# Patient Record
Sex: Female | Born: 1960
Health system: Southern US, Community
[De-identification: ages and names within clinical notes are randomized; demographics above are authoritative.]

## PROBLEM LIST (undated history)

## (undated) DIAGNOSIS — Z973 Presence of spectacles and contact lenses: Secondary | ICD-10-CM

## (undated) DIAGNOSIS — N950 Postmenopausal bleeding: Secondary | ICD-10-CM

## (undated) DIAGNOSIS — E042 Nontoxic multinodular goiter: Secondary | ICD-10-CM

## (undated) HISTORY — PX: HYSTEROSCOPY: SHX211

---

## 2008-08-09 ENCOUNTER — Other Ambulatory Visit: Admission: RE | Admit: 2008-08-09 | Discharge: 2008-08-09 | Payer: Self-pay | Admitting: Obstetrics and Gynecology

## 2008-11-30 HISTORY — PX: CARPAL TUNNEL RELEASE: SHX101

## 2008-12-12 ENCOUNTER — Ambulatory Visit: Payer: Self-pay | Admitting: General Practice

## 2008-12-17 ENCOUNTER — Ambulatory Visit: Payer: Self-pay | Admitting: General Practice

## 2009-02-05 ENCOUNTER — Encounter: Payer: Self-pay | Admitting: General Practice

## 2009-02-28 ENCOUNTER — Encounter: Payer: Self-pay | Admitting: General Practice

## 2009-11-12 ENCOUNTER — Ambulatory Visit: Payer: Self-pay | Admitting: Orthopedic Surgery

## 2012-10-07 LAB — HM MAMMOGRAPHY

## 2013-08-23 ENCOUNTER — Ambulatory Visit: Payer: Self-pay | Admitting: Certified Nurse Midwife

## 2013-08-25 ENCOUNTER — Ambulatory Visit: Payer: Self-pay | Admitting: Family Medicine

## 2013-09-11 ENCOUNTER — Ambulatory Visit (INDEPENDENT_AMBULATORY_CARE_PROVIDER_SITE_OTHER): Payer: BC Managed Care – PPO | Admitting: Certified Nurse Midwife

## 2013-09-11 ENCOUNTER — Encounter: Payer: Self-pay | Admitting: Certified Nurse Midwife

## 2013-09-11 VITALS — BP 110/60 | HR 64 | Resp 16 | Ht 62.75 in | Wt 139.0 lb

## 2013-09-11 DIAGNOSIS — Z309 Encounter for contraceptive management, unspecified: Secondary | ICD-10-CM

## 2013-09-11 DIAGNOSIS — Z0000 Encounter for general adult medical examination without abnormal findings: Secondary | ICD-10-CM

## 2013-09-11 DIAGNOSIS — Z01419 Encounter for gynecological examination (general) (routine) without abnormal findings: Secondary | ICD-10-CM

## 2013-09-11 LAB — POCT URINALYSIS DIPSTICK
Glucose, UA: NEGATIVE
Ketones, UA: NEGATIVE
Leukocytes, UA: NEGATIVE
Urobilinogen, UA: NEGATIVE

## 2013-09-11 MED ORDER — NORETHIN ACE-ETH ESTRAD-FE 1-20 MG-MCG PO TABS: 1.0000 | ORAL_TABLET | Freq: Every day | ORAL | Status: DC

## 2013-09-11 NOTE — Progress Notes (Signed)
52 y.o. G66P1001 Married Caucasian Fe here for annual exam. Periods normal, no issues. Now a grandmother! No health issues today.Contraception working well. Sees PCP prn.   Patient's last menstrual period was 08/31/2013.          Sexually active: yes  The current method of family planning is OCP (estrogen/progesterone).    Exercising: yes  walking Smoker:  no  Health Maintenance: Pap:  08-22-12 neg HPV HR neg MMG:  11/13 Colonoscopy:  none BMD:   none TDaP: 12/01/2003 Labs: Poct urine- neg, Hgb-13.1 Self breast exam: done occ   reports that she has quit smoking. She does not have any smokeless tobacco history on file. She reports that she does not drink alcohol or use illicit drugs.  History reviewed. No pertinent past medical history.  Past Surgical History  Procedure Laterality Date  . Carpal tunnel release Right     Current Outpatient Prescriptions  Medication Sig Dispense Refill  . Cholecalciferol (VITAMIN D PO) Take by mouth daily.      . naproxen sodium (ANAPROX) 220 MG tablet Take 220 mg by mouth 2 (two) times daily with a meal.      . norethindrone-ethinyl estradiol (JUNEL FE,GILDESS FE,LOESTRIN FE) 1-20 MG-MCG tablet Take 1 tablet by mouth daily.       No current facility-administered medications for this visit.    Family History  Problem Relation Age of Onset  . Hypertension Mother   . Thyroid disease Mother   . Diabetes Father   . Cancer Father     prostate  . Hypertension Father   . Heart disease Father     ROS:  Pertinent items are noted in HPI.  Otherwise, a comprehensive ROS was negative.  Exam:   BP 110/60  Pulse 64  Resp 16  Ht 5' 2.75" (1.594 m)  Wt 139 lb (63.05 kg)  BMI 24.81 kg/m2  LMP 08/31/2013 Height: 5' 2.75" (159.4 cm)  Ht Readings from Last 3 Encounters:  09/11/13 5' 2.75" (1.594 m)    General appearance: alert, cooperative and appears stated age Head: Normocephalic, without obvious abnormality, atraumatic Neck: no adenopathy,  supple, symmetrical, trachea midline and thyroid normal to inspection and palpation Lungs: clear to auscultation bilaterally Breasts: normal appearance, no masses or tenderness, No nipple retraction or dimpling, No nipple discharge or bleeding, No axillary or supraclavicular adenopathy Heart: regular rate and rhythm Abdomen: soft, non-tender; no masses,  no organomegaly Extremities: extremities normal, atraumatic, no cyanosis or edema Skin: Skin color, texture, turgor normal. No rashes or lesions Lymph nodes: Cervical, supraclavicular, and axillary nodes normal. No abnormal inguinal nodes palpated Neurologic: Grossly normal   Pelvic: External genitalia:  no lesions              Urethra:  normal appearing urethra with no masses, tenderness or lesions              Bartholin's and Skene's: normal                 Vagina: normal appearing vagina with normal color and discharge, no lesions              Cervix: normal, non tender              Pap taken: no Bimanual Exam:  Uterus:  normal size, contour, position, consistency, mobility, non-tender and anteverted              Adnexa: normal adnexa and no mass, fullness, tenderness  Rectovaginal: Confirms               Anus:  normal sphincter tone, no lesions  A:  Well Woman with normal exam  Contraception OCP  Non fasting labs  P:   Reviewed health and wellness pertinent to exam  Rx Loestrin 1/20 see order  Labs:CMP,Lipid panel,TSH  Pap smear as per guidelines   Mammogram yearly pap smear not taken today  counseled on breast self exam, mammography screening, use and side effects of OCP's, menopause, adequate intake of calcium and vitamin D, diet and exercise  return annually or prn  An After Visit Summary was printed and given to the patient.

## 2013-09-11 NOTE — Patient Instructions (Signed)

## 2013-09-12 LAB — COMPREHENSIVE METABOLIC PANEL
ALT: 13 U/L (ref 0–35)
Albumin: 3.8 g/dL (ref 3.5–5.2)
CO2: 23 mEq/L (ref 19–32)
Calcium: 9.4 mg/dL (ref 8.4–10.5)
Chloride: 105 mEq/L (ref 96–112)
Glucose, Bld: 76 mg/dL (ref 70–99)
Sodium: 138 mEq/L (ref 135–145)
Total Bilirubin: 0.6 mg/dL (ref 0.3–1.2)
Total Protein: 6.5 g/dL (ref 6.0–8.3)

## 2013-09-12 LAB — HEMOGLOBIN, FINGERSTICK: Hemoglobin, fingerstick: 13.1 g/dL (ref 12.0–16.0)

## 2013-09-12 LAB — LIPID PANEL
Cholesterol: 151 mg/dL (ref 0–200)
Total CHOL/HDL Ratio: 2.8 Ratio

## 2013-09-14 NOTE — Progress Notes (Signed)
Note reviewed, agree with plan.  Sanjuanita Condrey, MD  

## 2014-09-10 ENCOUNTER — Other Ambulatory Visit: Payer: Self-pay | Admitting: Certified Nurse Midwife

## 2014-09-10 NOTE — Telephone Encounter (Signed)
Last AEX: 09/11/13 Last refill:09/11/13 #1 X 12 Current AEX:09/17/14  Sent 1 mth supply in until Pt is seen for her AEX

## 2014-09-17 ENCOUNTER — Telehealth: Payer: Self-pay | Admitting: Certified Nurse Midwife

## 2014-09-17 ENCOUNTER — Ambulatory Visit: Payer: BC Managed Care – PPO | Admitting: Certified Nurse Midwife

## 2014-09-17 NOTE — Telephone Encounter (Signed)
No I think that it is not appropriate

## 2014-09-17 NOTE — Telephone Encounter (Signed)
Pt called and canceled her appt for today at 3:30 pm. Pt says that her father has to have emergency surgery this morning she just found out 15 mins ago. Pt rescheduled for tomorrow at 3:30 pm. Do you want to charge pt for late cancellation?

## 2014-09-17 NOTE — Telephone Encounter (Signed)
Okay didn't think so. Okay to close encounter.

## 2014-09-17 NOTE — Telephone Encounter (Signed)
Yes closed

## 2014-09-18 ENCOUNTER — Ambulatory Visit (INDEPENDENT_AMBULATORY_CARE_PROVIDER_SITE_OTHER): Payer: BC Managed Care – PPO | Admitting: Certified Nurse Midwife

## 2014-09-18 ENCOUNTER — Encounter: Payer: Self-pay | Admitting: Certified Nurse Midwife

## 2014-09-18 VITALS — BP 122/72 | HR 68 | Ht 62.75 in | Wt 136.0 lb

## 2014-09-18 DIAGNOSIS — Z01419 Encounter for gynecological examination (general) (routine) without abnormal findings: Secondary | ICD-10-CM

## 2014-09-18 DIAGNOSIS — Z0000 Encounter for general adult medical examination without abnormal findings: Secondary | ICD-10-CM

## 2014-09-18 DIAGNOSIS — Z124 Encounter for screening for malignant neoplasm of cervix: Secondary | ICD-10-CM

## 2014-09-18 DIAGNOSIS — Z23 Encounter for immunization: Secondary | ICD-10-CM

## 2014-09-18 LAB — POCT URINALYSIS DIPSTICK
Bilirubin, UA: NEGATIVE
Blood, UA: NEGATIVE
GLUCOSE UA: NEGATIVE
KETONES UA: NEGATIVE
Leukocytes, UA: NEGATIVE
Nitrite, UA: NEGATIVE
Protein, UA: NEGATIVE
Urobilinogen, UA: NEGATIVE
pH, UA: 6

## 2014-09-18 LAB — HEMOGLOBIN, FINGERSTICK: HEMOGLOBIN, FINGERSTICK: 12.9 g/dL (ref 12.0–16.0)

## 2014-09-18 MED ORDER — NORETHIN-ETH ESTRAD-FE BIPHAS 1 MG-10 MCG / 10 MCG PO TABS: 1.0000 | ORAL_TABLET | Freq: Every day | ORAL | Status: DC

## 2014-09-18 NOTE — Progress Notes (Signed)
53 y.o. 411P1001 Married Caucasian Fe here for annual exam. Periods lighter, but other wise normal. OCP working well, desires continuance. Sees PCP for aex/ labs as indicated. Social stress with father diagnosed with progressive lung cancer. 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 Can not use Hospice unless treatment ceases, which will stop due to complications. Porta cath removed. Spouse supportive and daughter. No other health issues today.    Patient's last menstrual period was 08/30/2014.          Sexually active: Yes.    The current method of family planning is OCP (estrogen/progesterone).    Exercising: No.  The patient does not participate in regular exercise at present. Smoker:  no  Health Maintenance: Pap:  08/22/13 neg HR HPV  MMG:  10/10/13 Bi-Rads Neg Colonoscopy:  never BMD:   never TDaP:  2005 Labs:   HgB:  12.9                           UA:  Neg, ph: 6.0   reports that she has quit smoking. She does not have any smokeless tobacco history on file. She reports that she does not drink alcohol or use illicit drugs.  History reviewed. No pertinent past medical history.  Past Surgical History  Procedure Laterality Date  . Carpal tunnel release Right     Current Outpatient Prescriptions  Medication Sig Dispense Refill  . Cholecalciferol (VITAMIN D PO) Take by mouth daily.      Marland Kitchen. GILDESS FE 1/20 1-20 MG-MCG tablet take 1 tablet by mouth once daily  28 tablet  0  . naproxen sodium (ANAPROX) 220 MG tablet Take 220 mg by mouth 2 (two) times daily with a meal.       No current facility-administered medications for this visit.    Family History  Problem Relation Age of Onset  . Hypertension Mother   . Thyroid disease Mother   . Diabetes Father   . Cancer Father     prostate  . Hypertension Father   . Heart disease Father     ROS:  Pertinent items are noted in HPI.  Otherwise, a comprehensive ROS was negative.  Exam:   BP 122/72   Pulse 68  Ht 5' 2.75" (1.594 m)  Wt 136 lb (61.689 kg)  BMI 24.28 kg/m2  LMP 08/30/2014 Height: 5' 2.75" (159.4 cm)  Ht Readings from Last 3 Encounters:  09/18/14 5' 2.75" (1.594 m)  09/11/13 5' 2.75" (1.594 m)    General appearance: alert, cooperative and appears stated age Head: Normocephalic, without obvious abnormality, atraumatic Neck: no adenopathy, supple, symmetrical, trachea midline and thyroid normal to inspection and palpation Lungs: clear to auscultation bilaterally Breasts: normal appearance, no masses or tenderness, No nipple retraction or dimpling, No nipple discharge or bleeding, No axillary or supraclavicular adenopathy Heart: regular rate and rhythm Abdomen: soft, non-tender; no masses,  no organomegaly Extremities: extremities normal, atraumatic, no cyanosis or edema Skin: Skin color, texture, turgor normal. No rashes or lesions Lymph nodes: Cervical, supraclavicular, and axillary nodes normal. No abnormal inguinal nodes palpated Neurologic: Grossly normal   Pelvic: External genitalia:  no lesions              Urethra:  normal appearing urethra with no masses, tenderness or lesions              Bartholin's and Skene's: normal  Vagina: normal appearing vagina with normal color and discharge, no lesions              Cervix: normal, non tender              Pap taken: No. Bimanual Exam:  Uterus:  normal size, contour, position, consistency, mobility, non-tender and anteverted              Adnexa: normal adnexa and no mass, fullness, tenderness               Rectovaginal: Confirms               Anus:  normal sphincter tone, no lesions  A:  Well Woman with normal exam  Contraception OCP desired  Social stress with father's lung cancer  Immunization update  P:   Reviewed health and wellness pertinent to exam  Discussed due to age need to reduce dosage, patient agreeable.  Rx Lo loestrin Fe see order  Discussed friends support and hospice emotional  support services. Patient will contact and seek what is available.  Requested TDAP  Pap smear  taken today with HPV reflex  counseled on breast self exam, mammography screening, use and side effects of OCP's, adequate intake of calcium and vitamin D, diet and exercise Prayed with patient and her needs per request.  return annually or prn  An After Visit Summary was printed and given to the patient.

## 2014-09-18 NOTE — Patient Instructions (Signed)

## 2014-09-20 LAB — IPS PAP TEST WITH REFLEX TO HPV

## 2014-09-21 NOTE — Progress Notes (Signed)
Reviewed personally.  M. Suzanne Aroura Vasudevan, MD.  

## 2014-10-01 ENCOUNTER — Encounter: Payer: Self-pay | Admitting: Certified Nurse Midwife

## 2015-09-25 ENCOUNTER — Ambulatory Visit: Payer: Self-pay | Admitting: Certified Nurse Midwife

## 2015-09-26 ENCOUNTER — Encounter: Payer: Self-pay | Admitting: Certified Nurse Midwife

## 2015-09-26 ENCOUNTER — Ambulatory Visit (INDEPENDENT_AMBULATORY_CARE_PROVIDER_SITE_OTHER): Payer: BLUE CROSS/BLUE SHIELD | Admitting: Certified Nurse Midwife

## 2015-09-26 VITALS — BP 118/70 | HR 72 | Resp 16 | Ht 62.5 in | Wt 149.6 lb

## 2015-09-26 DIAGNOSIS — Z0000 Encounter for general adult medical examination without abnormal findings: Secondary | ICD-10-CM

## 2015-09-26 DIAGNOSIS — Z01419 Encounter for gynecological examination (general) (routine) without abnormal findings: Secondary | ICD-10-CM

## 2015-09-26 DIAGNOSIS — Z1211 Encounter for screening for malignant neoplasm of colon: Secondary | ICD-10-CM | POA: Diagnosis not present

## 2015-09-26 LAB — POCT URINALYSIS DIPSTICK
Bilirubin, UA: NEGATIVE
COLOR UA: NEGATIVE
Clarity, UA: NEGATIVE
Glucose, UA: NEGATIVE
Ketones, UA: NEGATIVE
Leukocytes, UA: NEGATIVE
NITRITE UA: NEGATIVE
Protein, UA: NEGATIVE
RBC UA: NEGATIVE
Urobilinogen, UA: NEGATIVE
pH, UA: 5

## 2015-09-26 LAB — TSH: TSH: 3.473 u[IU]/mL (ref 0.350–4.500)

## 2015-09-26 LAB — COMPREHENSIVE METABOLIC PANEL
ALBUMIN: 4.2 g/dL (ref 3.6–5.1)
ALT: 12 U/L (ref 6–29)
AST: 19 U/L (ref 10–35)
Alkaline Phosphatase: 59 U/L (ref 33–130)
BILIRUBIN TOTAL: 0.5 mg/dL (ref 0.2–1.2)
BUN: 13 mg/dL (ref 7–25)
CALCIUM: 9.5 mg/dL (ref 8.6–10.4)
CO2: 24 mmol/L (ref 20–31)
CREATININE: 1.08 mg/dL — AB (ref 0.50–1.05)
Chloride: 106 mmol/L (ref 98–110)
Glucose, Bld: 91 mg/dL (ref 65–99)
Potassium: 5.1 mmol/L (ref 3.5–5.3)
SODIUM: 138 mmol/L (ref 135–146)
TOTAL PROTEIN: 6.9 g/dL (ref 6.1–8.1)

## 2015-09-26 LAB — CBC
HCT: 41.4 % (ref 36.0–46.0)
Hemoglobin: 14.3 g/dL (ref 12.0–15.0)
MCH: 31.6 pg (ref 26.0–34.0)
MCHC: 34.5 g/dL (ref 30.0–36.0)
MCV: 91.4 fL (ref 78.0–100.0)
MPV: 9.9 fL (ref 8.6–12.4)
PLATELETS: 325 10*3/uL (ref 150–400)
RBC: 4.53 MIL/uL (ref 3.87–5.11)
RDW: 12.2 % (ref 11.5–15.5)
WBC: 7.5 10*3/uL (ref 4.0–10.5)

## 2015-09-26 LAB — HEMOGLOBIN, FINGERSTICK: HEMOGLOBIN, FINGERSTICK: 13.9 g/dL (ref 12.0–16.0)

## 2015-09-26 LAB — LIPID PANEL
CHOLESTEROL: 193 mg/dL (ref 125–200)
HDL: 50 mg/dL (ref >=46)
LDL Cholesterol: 125 mg/dL (ref <130)
Total CHOL/HDL Ratio: 3.9 Ratio (ref <=5.0)
Triglycerides: 89 mg/dL (ref <150)
VLDL: 18 mg/dL (ref <30)

## 2015-09-26 NOTE — Progress Notes (Signed)
Patient ID: Renee Abbott, female   DOB: 1961-04-13, 54 y.o.   MRN: [id number] 54 y.o. G80P1001 Married  Caucasian Fe here for annual exam. Periods sporadic on OCP. Denies hot flashes or night sweats. Patient aware of need to be off OCP now at 54. On last week of current pack of OCP now. Spouse has had vasectomy so contraception is not a concern, using only for cycle control. Would be interested in HRT if perimenopausal. Sees PCP prn only. Dad passed after long battle with lung cancer in 1/16. "Still really miss him". Daughter expecting again!, but has placenta previa, so concerned for her. Spouse supportive and friends. Desires screening labs today if needed. No other health issues today.  No LMP recorded.          Sexually active: Yes.    The current method of family planning is OCP (estrogen/progesterone)--Lo Loestrin.    Exercising: No.   Smoker:  Former  Health Maintenance: Pap:  09-18-14 Neg;no HR HPV done(neg HR HPV 2013) MMG:  Density Cat.C/Neg/BiRads1:[facility] Colonoscopy:  NEVER BMD:   n/a TDaP:  09-18-14 Labs: Hgb 13.9, Urine Dip:Neg   reports that she has quit smoking. She does not have any smokeless tobacco history on file. She reports that she does not drink alcohol or use illicit drugs.  No past medical history on file.  Past Surgical History  Procedure Laterality Date  . Carpal tunnel release Right     Current Outpatient Prescriptions  Medication Sig Dispense Refill  . Cholecalciferol (VITAMIN D PO) Take by mouth daily.    . naproxen sodium (ANAPROX) 220 MG tablet Take 220 mg by mouth 2 (two) times daily with a meal.    . Norethindrone-Ethinyl Estradiol-Fe Biphas (LO LOESTRIN FE) 1 MG-10 MCG / 10 MCG tablet Take 1 tablet by mouth daily. 1 Package 12   No current facility-administered medications for this visit.    Family History  Problem Relation Age of Onset  . Hypertension Mother   . Thyroid disease Mother   . Diabetes Father   . Cancer Father    prostate, Lung dx 06/26/14  . Hypertension Father   . Heart disease Father     ROS:  Pertinent items are noted in HPI.  Otherwise, a comprehensive ROS was negative.  Exam:   BP 140/88 mmHg  Pulse 60  Resp 16  Ht 5' 2.5" (1.588 m)  Wt 149 lb 9.6 oz (67.858 kg)  BMI 26.91 kg/m2 Height: 5' 2.5" (158.8 cm) Ht Readings from Last 3 Encounters:  09/26/15 5' 2.5" (1.588 m)  09/18/14 5' 2.75" (1.594 m)  09/11/13 5' 2.75" (1.594 m)    General appearance: alert, cooperative and appears stated age Head: Normocephalic, without obvious abnormality, atraumatic Neck: no adenopathy, supple, symmetrical, trachea midline and thyroid normal to inspection and palpation Lungs: clear to auscultation bilaterally Breasts: normal appearance, no masses or tenderness, No nipple retraction or dimpling, No nipple discharge or bleeding, No axillary or supraclavicular adenopathy Heart: regular rate and rhythm Abdomen: soft, non-tender; no masses,  no organomegaly Extremities: extremities normal, atraumatic, no cyanosis or edema Skin: Skin color, texture, turgor normal. No rashes or lesions Lymph nodes: Cervical, supraclavicular, and axillary nodes normal. No abnormal inguinal nodes palpated Neurologic: Grossly normal   Pelvic: External genitalia:  no lesions              Urethra:  normal appearing urethra with no masses, tenderness or lesions  Bartholin's and Skene's: normal                 Vagina: normal appearing vagina with normal color and discharge, no lesions              Cervix: normal appearance, non tender, no lesions              Pap taken: No. Bimanual Exam:  Uterus:  normal size, contour, position, consistency, mobility, non-tender              Adnexa: normal adnexa and no mass, fullness, tenderness               Rectovaginal: Confirms               Anus:  normal sphincter tone, no lesions    A:  Well Woman with normal exam  Contraception OCP for cycle control only, spouse  vasectomy  ? Perimenopausal with amenorrhea episodes  Social stress with family death and daughter pregnancy  Screening labs  P:   Reviewed health and wellness pertinent to exam  Discussed need to be off OCP now due to age. [facility] two [facility].(order placed) Will not restart OCP, if perimenopausal will consider HRT. Patient agrees with plan. Risks and benefits of HRT given. Questions addressed.  Discussed grief support through Hospice who her Dad used. Patient may try. Will advise if feels depressed.  Labs:TSH,Lipid panel, CBC, CMP, Vit. D  Pap smear as above not taken   counseled on breast self exam, mammography screening, use and side effects of HRT, adequate intake of calcium and vitamin D, diet and exercise. Discussed risks and benefits of colonoscopy, declines scheduling today. IFOB dispensed.  return annually or prn  An After Visit Summary was printed and given to the patient.

## 2015-09-26 NOTE — Patient Instructions (Signed)
EXERCISE AND DIET:  We recommended that you start or continue a regular exercise program for good health. Regular exercise means any activity that makes your heart beat faster and makes you sweat.  We recommend exercising at least 30 minutes per day at least 3 days a week, preferably 4 or 5.  We also recommend a diet low in fat and sugar.  Inactivity, poor dietary choices and obesity can cause diabetes, heart attack, stroke, and kidney damage, among others.    ALCOHOL AND SMOKING:  Women should limit their alcohol intake to no more than 7 drinks/beers/glasses of wine (combined, not each!) per week. Moderation of alcohol intake to this level decreases your risk of breast cancer and liver damage. And of course, no recreational drugs are part of a healthy lifestyle.  And absolutely no smoking or even second hand smoke. Most people know smoking can cause heart and lung diseases, but did you know it also contributes to weakening of your bones? Aging of your skin?  Yellowing of your teeth and nails?  CALCIUM AND VITAMIN D:  Adequate intake of calcium and Vitamin D are recommended.  The recommendations for exact amounts of these supplements seem to change often, but generally speaking 600 mg of calcium (either carbonate or citrate) and 800 units of Vitamin D per day seems prudent. Certain women may benefit from higher intake of Vitamin D.  If you are among these women, your doctor will have told you during your visit.    PAP SMEARS:  Pap smears, to check for cervical cancer or precancers,  have traditionally been done yearly, although recent scientific advances have shown that most women can have pap smears less often.  However, every woman still should have a physical exam from her gynecologist every year. It will include a breast check, inspection of the vulva and vagina to check for abnormal growths or skin changes, a visual exam of the cervix, and then an exam to evaluate the size and shape of the uterus and  ovaries.  And after 52 years of age, a rectal exam is indicated to check for rectal cancers. We will also provide age appropriate advice regarding health maintenance, like when you should have certain vaccines, screening for sexually transmitted diseases, bone density testing, colonoscopy, mammograms, etc.   MAMMOGRAMS:  All women over 40 years old should have a yearly mammogram. Many facilities now offer a "3D" mammogram, which may cost around $50 extra out of pocket. If possible,  we recommend you accept the option to have the 3D mammogram performed.  It both reduces the number of women who will be called back for extra views which then turn out to be normal, and it is better than the routine mammogram at detecting truly abnormal areas.    COLONOSCOPY:  Colonoscopy to screen for colon cancer is recommended for all women at age 50.  We know, you hate the idea of the prep.  We agree, BUT, having colon cancer and not knowing it is worse!!  Colon cancer so often starts as a polyp that can be seen and removed at colonscopy, which can quite literally save your life!  And if your first colonoscopy is normal and you have no family history of colon cancer, most women don't have to have it again for 10 years.  Once every ten years, you can do something that may end up saving your life, right?  We will be happy to help you get it scheduled when you are ready.    Be sure to check your insurance coverage so you understand how much it will cost.  It may be covered as a preventative service at no cost, but you should check your particular policy.     Perimenopause Perimenopause is the time when your body begins to move into the menopause (no menstrual period for 12 straight months). It is a natural process. Perimenopause can begin 2-8 years before the menopause and usually lasts for 1 year after the menopause. During this time, your ovaries may or may not produce an egg. The ovaries vary in their production of estrogen and  progesterone hormones each month. This can cause irregular menstrual periods, difficulty getting pregnant, vaginal bleeding between periods, and uncomfortable symptoms. CAUSES  Irregular production of the ovarian hormones, estrogen and progesterone, and not ovulating every month.  Other causes include:  Tumor of the pituitary gland in the brain.  Medical disease that affects the ovaries.  Radiation treatment.  Chemotherapy.  Unknown causes.  Heavy smoking and excessive alcohol intake can bring on perimenopause sooner. SIGNS AND SYMPTOMS   Hot flashes.  Night sweats.  Irregular menstrual periods.  Decreased sex drive.  Vaginal dryness.  Headaches.  Mood swings.  Depression.  Memory problems.  Irritability.  Tiredness.  Weight gain.  Trouble getting pregnant.  The beginning of losing bone cells (osteoporosis).  The beginning of hardening of the arteries (atherosclerosis). DIAGNOSIS  Your health care provider will make a diagnosis by analyzing your age, menstrual history, and symptoms. He or she will do a physical exam and note any changes in your body, especially your female organs. Female hormone tests may or may not be helpful depending on the amount of female hormones you produce and when you produce them. However, other hormone tests may be helpful to rule out other problems. TREATMENT  In some cases, no treatment is needed. The decision on whether treatment is necessary during the perimenopause should be made by you and your health care provider based on how the symptoms are affecting you and your lifestyle. Various treatments are available, such as:  Treating individual symptoms with a specific medicine for that symptom.  Herbal medicines that can help specific symptoms.  Counseling.  Group therapy. HOME CARE INSTRUCTIONS   Keep track of your menstrual periods (when they occur, how heavy they are, how long between periods, and how long they last) as  well as your symptoms and when they started.  Only take over-the-counter or prescription medicines as directed by your health care provider.  Sleep and rest.  Exercise.  Eat a diet that contains calcium (good for your bones) and soy (acts like the estrogen hormone).  Do not smoke.  Avoid alcoholic beverages.  Take vitamin supplements as recommended by your health care provider. Taking vitamin E may help in certain cases.  Take calcium and vitamin D supplements to help prevent bone loss.  Group therapy is sometimes helpful.  Acupuncture may help in some cases. SEEK MEDICAL CARE IF:   You have questions about any symptoms you are having.  You need a referral to a specialist (gynecologist, psychiatrist, or psychologist). SEEK IMMEDIATE MEDICAL CARE IF:   You have vaginal bleeding.  Your period lasts longer than 8 days.  Your periods are recurring sooner than 21 days.  You have bleeding after intercourse.  You have severe depression.  You have pain when you urinate.  You have severe headaches.  You have vision problems.   This information is not intended to replace advice   given to you by your health care provider. Make sure you discuss any questions you have with your health care provider.   Document Released: 12/24/2004 Document Revised: 12/07/2014 Document Reviewed: 06/15/2013 Elsevier Interactive Patient Education 2016 [employer]  

## 2015-09-26 NOTE — Progress Notes (Signed)
Reviewed personally.  M. Suzanne Johana Hopkinson, MD.  

## 2015-09-27 ENCOUNTER — Other Ambulatory Visit: Payer: Self-pay | Admitting: Certified Nurse Midwife

## 2015-09-27 ENCOUNTER — Telehealth: Payer: Self-pay | Admitting: *Deleted

## 2015-09-27 DIAGNOSIS — R899 Unspecified abnormal finding in specimens from other organs, systems and tissues: Secondary | ICD-10-CM

## 2015-09-27 LAB — VITAMIN D 25 HYDROXY (VIT D DEFICIENCY, FRACTURES): Vit D, 25-Hydroxy: 63 ng/mL (ref 30–100)

## 2015-09-27 NOTE — Telephone Encounter (Signed)
Notes Recorded by Dion Bodyeina C Beltran, CMA on 09/27/2015 at 9:17 AM LM for pt to call back. Notes Recorded by Verner Choleborah S Leonard, CNM on 09/27/2015 at 7:54 AM Notify patient that her Vitamin D is normal Lipid panel looks great TSH and CBC is normal Liver, glucose and kidney profile essentially normal, creatinine is slightly elevated, decrease protein intake if eating high amounts and will recheck with your other lab in 2- 3 weeks, order placed

## 2015-09-30 NOTE — Telephone Encounter (Signed)
Returning call.

## 2015-09-30 NOTE — Telephone Encounter (Signed)
Left Voicemail to call back

## 2015-10-01 NOTE — Telephone Encounter (Signed)
Patient notified of results. See lab 

## 2015-10-03 ENCOUNTER — Other Ambulatory Visit: Payer: Self-pay | Admitting: Certified Nurse Midwife

## 2015-10-03 NOTE — Telephone Encounter (Signed)
No refills to be given. She is coming for labs to start on HRT. denied

## 2015-10-03 NOTE — Telephone Encounter (Signed)
Medication refill request: Lo Loestrin Last AEX:  09-26-15 Next AEX: 09-30-16 Last MMG (if hormonal medication request): 10-12-14 WNL Refill authorized: please advise

## 2015-10-09 ENCOUNTER — Ambulatory Visit: Payer: Self-pay | Admitting: Certified Nurse Midwife

## 2015-10-10 ENCOUNTER — Other Ambulatory Visit: Payer: Self-pay | Admitting: Certified Nurse Midwife

## 2015-10-10 ENCOUNTER — Other Ambulatory Visit (INDEPENDENT_AMBULATORY_CARE_PROVIDER_SITE_OTHER): Payer: BLUE CROSS/BLUE SHIELD

## 2015-10-10 DIAGNOSIS — R899 Unspecified abnormal finding in specimens from other organs, systems and tissues: Secondary | ICD-10-CM

## 2015-10-10 LAB — CREATININE, SERUM: CREATININE: 0.9 mg/dL (ref 0.50–1.05)

## 2015-10-11 ENCOUNTER — Other Ambulatory Visit: Payer: Self-pay | Admitting: Certified Nurse Midwife

## 2015-10-11 DIAGNOSIS — R899 Unspecified abnormal finding in specimens from other organs, systems and tissues: Secondary | ICD-10-CM

## 2015-10-11 LAB — FOLLICLE STIMULATING HORMONE: FSH: 87.7 m[IU]/mL

## 2015-10-11 LAB — PROLACTIN: PROLACTIN: 7.7 ng/mL

## 2015-10-17 LAB — FECAL OCCULT BLOOD, IMMUNOCHEMICAL: IMMUNOLOGICAL FECAL OCCULT BLOOD TEST: NEGATIVE

## 2015-11-01 ENCOUNTER — Ambulatory Visit: Payer: BLUE CROSS/BLUE SHIELD | Admitting: Certified Nurse Midwife

## 2015-11-08 ENCOUNTER — Encounter: Payer: Self-pay | Admitting: Certified Nurse Midwife

## 2015-11-08 ENCOUNTER — Ambulatory Visit (INDEPENDENT_AMBULATORY_CARE_PROVIDER_SITE_OTHER): Payer: BLUE CROSS/BLUE SHIELD | Admitting: Certified Nurse Midwife

## 2015-11-08 VITALS — BP 122/70 | Ht 62.5 in | Wt 152.0 lb

## 2015-11-08 DIAGNOSIS — N951 Menopausal and female climacteric states: Secondary | ICD-10-CM

## 2015-11-08 NOTE — Progress Notes (Signed)
[age] yrs Married Caucasian female here to discuss intitation of  HRT. Has begun  to have hot flashes since she stopped OCP one month ago. Has had no menses on OCP in past 2-3 years. Some night sweats. Has read about HRT and feels well informed and would like to start on if no concerns.   Her medical history is significant for: no issues as below.  Heart disease no DVT No. Hypertension No. Osteopenia/ osteoporosis No. Breast Cancer No. Other prior cancer within 10 yrs No. Adherence and retention concerns No. Current Mammogram No. scheduled in 2 weeks Current Annual Exam Yes.   Low hematocrit or platelet count No. Elevated triglyceride level No. Other bio-identical current Hormonal treatment No.   FSH 87.7, TSH  3.473,  UPT negative,  See other labs and aex record.  Family Medical history is significant for:  Breast Cancer No. Ovarian Cancer No. Colon cancer No. Cardiovascular disease No.   Discussed WHI study with negative and positive benefits and risk of HRT including heart disease, CVA ,DVT and breast cancer.  Adverse events and side effects of hormone therapy:  Uterine bleeding expectations in combination therapies is 6 - 12 months, 40% and 60% respectively will be amenorrheic.  If bleeding continues after 6 months to call back. Use of estrogen may increase the risk of Gallbladder disease. No unopposed estrogen dosing.   A: Menopausal no contraindication for HRT use.  Mammogram due  Plan: After discussion,patient would like to start on HRT.  Patient will have mammogram and call when completed. If normal will start on Prempro. Instructions will be given for use and expectations. Warning signs with use given and will advise if vaginal bleeding occurs. Questions addressed.   Rv  In 2 months to assess once started on HRT   20  minutes spent with patient in face to face counseling regarding HRT usage.

## 2015-11-09 ENCOUNTER — Encounter: Payer: Self-pay | Admitting: Certified Nurse Midwife

## 2015-11-09 NOTE — Patient Instructions (Signed)

## 2015-11-13 NOTE — Progress Notes (Signed)
Reviewed personally.  M. Suzanne Patrick Salemi, MD.  

## 2015-11-14 ENCOUNTER — Encounter: Payer: Self-pay | Admitting: Certified Nurse Midwife

## 2015-11-14 ENCOUNTER — Telehealth: Payer: Self-pay | Admitting: Certified Nurse Midwife

## 2015-11-14 NOTE — Telephone Encounter (Signed)
Order for screening mammogram faxed to [facility] with cover sheet and confirmation at [phone]. Patient notified.  Routing to provider for final review. Patient agreeable to disposition. Will close encounter.

## 2015-11-14 NOTE — Telephone Encounter (Signed)
Patient says that [facility] where she gets her mammogram done needs a order for her mammogram faxed to them.  Fax # [phone]  Best # to reach patient for any questions: [phone]

## 2015-11-22 ENCOUNTER — Telehealth: Payer: Self-pay | Admitting: Certified Nurse Midwife

## 2015-11-22 NOTE — Telephone Encounter (Signed)
Patient calling to let Renee Abbott, CNM know she dad her mammogram at [facility] yesterday, 11/21/15. She said she was told to call with this information.

## 2015-11-22 NOTE — Telephone Encounter (Signed)
[facility] to [facility]. Spoke with Toni AmendCourtney who states the report has not been read yet. Will fax report over to the office once this is complete. Spoke with patient. Advised we are awaiting the report from [facility]. Once this has been received Leota Sauerseborah Leonard CNM will review and advise regarding HRT.

## 2015-11-27 ENCOUNTER — Other Ambulatory Visit: Payer: Self-pay | Admitting: Certified Nurse Midwife

## 2015-11-27 DIAGNOSIS — N951 Menopausal and female climacteric states: Secondary | ICD-10-CM

## 2015-11-27 MED ORDER — CONJ ESTROG-MEDROXYPROGEST ACE 0.3-1.5 MG PO TABS: 1.0000 | ORAL_TABLET | Freq: Every day | ORAL | Status: DC

## 2015-11-27 NOTE — Telephone Encounter (Signed)
Please notify patient that I have reviewed her mammogram and it is negative with class C density. Order in for HRT Prempro sent to pharmacy. Start on one daily, notify if any bleeding or warning signs reviewed at visit and read medication insert. Schedule 3 month evaluation

## 2015-11-27 NOTE — Telephone Encounter (Signed)
Patient notified as written by provider. appt scheduled.

## 2015-12-12 ENCOUNTER — Other Ambulatory Visit (INDEPENDENT_AMBULATORY_CARE_PROVIDER_SITE_OTHER): Payer: BLUE CROSS/BLUE SHIELD

## 2015-12-12 DIAGNOSIS — R899 Unspecified abnormal finding in specimens from other organs, systems and tissues: Secondary | ICD-10-CM

## 2015-12-12 LAB — CREATININE, SERUM: Creat: 0.9 mg/dL (ref 0.50–1.05)

## 2016-01-03 ENCOUNTER — Telehealth: Payer: Self-pay | Admitting: Certified Nurse Midwife

## 2016-01-03 NOTE — Telephone Encounter (Signed)
Spoke with patient. Advised of message as seen below from Deborah Leonard CNM. She is agreeable and verbalizes understanding.  Routing to provider for final review. Patient agreeable to disposition. Will close encounter.  

## 2016-01-03 NOTE — Telephone Encounter (Signed)
Patient left message on our voicemail wanting to speak with nurse in regards to issue she is having. Best # to reach: [phone]

## 2016-01-03 NOTE — Telephone Encounter (Signed)
It is not unusual with starting HRT which is a lower hormonal dose than her OCP. This should stop. She had not had any bleeding with OCP in the past few years. Needs to advise if does not stop in the by Monday or if amount increases.

## 2016-01-03 NOTE — Telephone Encounter (Signed)
Spoke with patient. Patient states that she started on Prempro in December 2016. "Debbi told me to call in if I had any bleeding with the medication." Reports last night she noticed "pink" tinged fluid when she wiped. States there was a small piece of red tissue in the toilet as well. "It was like the tissue when you have your cycle. I do not know if I should be worried or not. I am not having bleeding like my cycle." Reports this occurred again this morning. She denies any urinary symptoms, fevers, chills, or pelvic discomfort. Advised I will speak with Leota Sauers CNM regarding her recommendations and return call. She is agreeable and verbalizes understanding.  Routing to provider for final review. Patient agreeable to disposition. Will close encounter.

## 2016-02-27 ENCOUNTER — Encounter: Payer: Self-pay | Admitting: Certified Nurse Midwife

## 2016-02-27 ENCOUNTER — Ambulatory Visit (INDEPENDENT_AMBULATORY_CARE_PROVIDER_SITE_OTHER): Payer: BLUE CROSS/BLUE SHIELD | Admitting: Certified Nurse Midwife

## 2016-02-27 VITALS — BP 110/66 | HR 70 | Resp 16 | Ht 62.5 in | Wt 147.0 lb

## 2016-02-27 DIAGNOSIS — N951 Menopausal and female climacteric states: Secondary | ICD-10-CM | POA: Diagnosis not present

## 2016-02-27 MED ORDER — CONJ ESTROG-MEDROXYPROGEST ACE 0.3-1.5 MG PO TABS: 1.0000 | ORAL_TABLET | Freq: Every day | ORAL | Status: DC

## 2016-02-27 NOTE — Progress Notes (Signed)
55 y.o. Married Caucasian G1P1001here for evaluation of Prempro initiated on 11/26/16 for menopausal symptoms. Patient had small amount of bleeding with onset of use for 2 days. Patient taking medication as prescribed. Denies missed pills, headaches, nausea, DVT warning signs or symptoms, or other changes. Hot flashes and night sweats have subsided. No anxiety issues or vaginal dryness. Patient feels much better now. Would like to remain on HRT. Social stress with grandmother who is receiving end of life care. Patient trying to be available for family and daughter who has new baby. No other health issues today.  O: Healthy female, WD WN Affect: normal orientation X 3    A: Menopausal HRT Prempro initiation follow up, working well Social stress with grandmother in  Hospice care   P: Discussed continuation of HRT and expectations with relief of symptoms and bleeding possibility. Patient aware and would like to continue. Reviewed warning signs with use, importance of SBE monthly and yearly mammograms and aex. Rx Prempro see order Discussed seeking other family and friend support during this difficult time. Encouraged on focusing on the joys of being a Grandmother herself!  Rv aex, prn   20 minutes spent with patient with >50% of time spent in face to face counseling regarding HRT use and social stress.

## 2016-02-28 NOTE — Progress Notes (Signed)
Encounter reviewed Jill Jertson, MD   

## 2016-09-28 ENCOUNTER — Other Ambulatory Visit: Payer: Self-pay | Admitting: Certified Nurse Midwife

## 2016-09-30 ENCOUNTER — Ambulatory Visit (INDEPENDENT_AMBULATORY_CARE_PROVIDER_SITE_OTHER): Payer: BLUE CROSS/BLUE SHIELD | Admitting: Certified Nurse Midwife

## 2016-09-30 ENCOUNTER — Encounter: Payer: Self-pay | Admitting: Certified Nurse Midwife

## 2016-09-30 VITALS — BP 110/64 | HR 64 | Resp 16 | Ht 62.25 in | Wt 140.0 lb

## 2016-09-30 DIAGNOSIS — Z01419 Encounter for gynecological examination (general) (routine) without abnormal findings: Secondary | ICD-10-CM

## 2016-09-30 DIAGNOSIS — N951 Menopausal and female climacteric states: Secondary | ICD-10-CM | POA: Diagnosis not present

## 2016-09-30 DIAGNOSIS — Z124 Encounter for screening for malignant neoplasm of cervix: Secondary | ICD-10-CM

## 2016-09-30 DIAGNOSIS — Z0000 Encounter for general adult medical examination without abnormal findings: Secondary | ICD-10-CM | POA: Diagnosis not present

## 2016-09-30 DIAGNOSIS — Z1211 Encounter for screening for malignant neoplasm of colon: Secondary | ICD-10-CM

## 2016-09-30 LAB — POCT URINALYSIS DIPSTICK
BILIRUBIN UA: NEGATIVE
Glucose, UA: NEGATIVE
KETONES UA: NEGATIVE
Leukocytes, UA: NEGATIVE
Nitrite, UA: NEGATIVE
PH UA: 5
Protein, UA: NEGATIVE
RBC UA: NEGATIVE
Urobilinogen, UA: NEGATIVE

## 2016-09-30 LAB — TSH: TSH: 3.16 m[IU]/L

## 2016-09-30 MED ORDER — CONJ ESTROG-MEDROXYPROGEST ACE 0.3-1.5 MG PO TABS: 1.0000 | ORAL_TABLET | Freq: Every day | ORAL | 12 refills | Status: DC

## 2016-09-30 NOTE — Patient Instructions (Signed)

## 2016-09-30 NOTE — Progress Notes (Signed)
55 y.o. 741P1001 Married  Caucasian Fe here for annual exam. Menopausal with occasional hot flash. HRT working well without problems. Denies vaginal bleeding or vaginal dryness. [facility]. Screening labs as needed. Enjoying grandchildren!  Patient's last menstrual period was 08/30/2014 (exact date).          Sexually active: Yes.    The current method of family planning is post menopausal status.    Exercising: Yes.    walking Smoker:  no  Health Maintenance: Pap:  09-18-14 neg MMG:  11-21-15 category c density birads 1:neg Colonoscopy:  none BMD:   none TDaP:  2015 Shingles: no Pneumonia: no Hep C and HIV: Done Labs: hgb-13.1,poct urine-neg Self breast exam: done occ   reports that she has quit smoking. She has never used smokeless tobacco. She reports that she does not drink alcohol or use drugs.  History reviewed. No pertinent past medical history.  Past Surgical History:  Procedure Laterality Date  . CARPAL TUNNEL RELEASE Right     Current Outpatient Prescriptions  Medication Sig Dispense Refill  . Cholecalciferol (VITAMIN D PO) Take 1,000 Units by mouth daily.     Marland Kitchen. estrogen, conjugated,-medroxyprogesterone (PREMPRO) 0.3-1.5 MG tablet Take 1 tablet by mouth daily. 30 tablet 7  . naproxen sodium (ANAPROX) 220 MG tablet Take 220 mg by mouth 2 (two) times daily with a meal.     No current facility-administered medications for this visit.     Family History  Problem Relation Age of Onset  . Hypertension Mother   . Thyroid disease Mother   . Diabetes Father   . Cancer Father [id number]    Dec metastatic Lung CA 2015prostate, Lung dx 06/26/14  . Hypertension Father   . Heart disease Father     ROS:  Pertinent items are noted in HPI.  Otherwise, a comprehensive ROS was negative.  Exam:   BP 110/64   Pulse 64   Resp 16   Ht 5' 2.25" (1.581 m)   Wt 140 lb (63.5 kg)   LMP 08/30/2014 (Exact Date)   BMI 25.40 kg/m  Height: 5' 2.25" (158.1 cm) Ht  Readings from Last 3 Encounters:  09/30/16 5' 2.25" (1.581 m)  02/27/16 5' 2.5" (1.588 m)  11/08/15 5' 2.5" (1.588 m)    General appearance: alert, cooperative and appears stated age Head: Normocephalic, without obvious abnormality, atraumatic Neck: no adenopathy, supple, symmetrical, trachea midline and thyroid normal to inspection and palpation Lungs: clear to auscultation bilaterally Breasts: normal appearance, no masses or tenderness, No nipple retraction or dimpling, No nipple discharge or bleeding, No axillary or supraclavicular adenopathy Heart: regular rate and rhythm Abdomen: soft, non-tender; no masses,  no organomegaly Extremities: extremities normal, atraumatic, no cyanosis or edema Skin: Skin color, texture, turgor normal. No rashes or lesions Lymph nodes: Cervical, supraclavicular, and axillary nodes normal. No abnormal inguinal nodes palpated Neurologic: Grossly normal   Pelvic: External genitalia:  no lesions              Urethra:  normal appearing urethra with no masses, tenderness or lesions              Bartholin's and Skene's: normal                 Vagina: normal appearing vagina with normal color and discharge, no lesions              Cervix: no bleeding following Pap, no cervical motion tenderness and no lesions  Pap taken: Yes.   Bimanual Exam:  Uterus:  normal size, contour, position, consistency, mobility, non-tender              Adnexa: normal adnexa and no mass, fullness, tenderness               Rectovaginal: Confirms               Anus:  normal sphincter tone, no lesions  Chaperone present: yes  A:  Well Woman with normal exam  Menopausal on HRT with good results and desires continuance.  Screening labs  Colonoscopy due  P:   Reviewed health and wellness pertinent to exam  Aware of need to evaluate if vaginal bleeding.  Rx Prempro see order with instructions  Labs: Hep C, TSH, Vitamin D  Discussed risks/benefits of colonoscopy.  Patient may schedule after first of year. IFOB dispensed  Pap smear as above with HPVHR   counseled on breast self exam, menopause, adequate intake of calcium and vitamin D, diet and exercise  return annually or prn  An After Visit Summary was printed and given to the patient.

## 2016-10-01 LAB — HEPATITIS C ANTIBODY: HCV AB: NEGATIVE

## 2016-10-01 LAB — HEMOGLOBIN, FINGERSTICK: Hemoglobin, fingerstick: 13.1 g/dL (ref 12.0–16.0)

## 2016-10-01 LAB — VITAMIN D 25 HYDROXY (VIT D DEFICIENCY, FRACTURES): Vit D, 25-Hydroxy: 61 ng/mL (ref 30–100)

## 2016-10-02 ENCOUNTER — Telehealth: Payer: Self-pay

## 2016-10-02 LAB — IPS PAP TEST WITH REFLEX TO HPV

## 2016-10-02 NOTE — Telephone Encounter (Signed)
-----   Message from Verner Choleborah S Leonard, CNM sent at 10/02/2016  7:50 AM EDT ----- Pap smear is negative 02 Yeast present on pap. Rx Terazol 7 if symptomatic no order placed

## 2016-10-02 NOTE — Telephone Encounter (Signed)
lmtcb

## 2016-10-02 NOTE — Telephone Encounter (Signed)
Patient notified of results. See lab 

## 2016-10-05 ENCOUNTER — Encounter: Payer: Self-pay | Admitting: Certified Nurse Midwife

## 2016-10-05 NOTE — Progress Notes (Signed)
Encounter reviewed Jill Jertson, MD   

## 2016-10-06 ENCOUNTER — Other Ambulatory Visit: Payer: Self-pay

## 2016-10-06 ENCOUNTER — Other Ambulatory Visit: Payer: Self-pay | Admitting: Certified Nurse Midwife

## 2016-10-06 DIAGNOSIS — B379 Candidiasis, unspecified: Secondary | ICD-10-CM

## 2016-10-06 LAB — FECAL OCCULT BLOOD, IMMUNOCHEMICAL: IFOBT: NEGATIVE

## 2016-10-06 MED ORDER — TERCONAZOLE 0.4 % VA CREA: TOPICAL_CREAM | VAGINAL | 0 refills | Status: DC

## 2016-10-06 NOTE — Telephone Encounter (Signed)
Yeast was noted on patients pap on 10-02-16. At that time patient did not have any symptoms. Pt states that she is having some vaginal itching & would like terazol 7 sent to pharmacy

## 2016-11-25 DIAGNOSIS — Z1231 Encounter for screening mammogram for malignant neoplasm of breast: Secondary | ICD-10-CM | POA: Diagnosis not present

## 2016-11-25 DIAGNOSIS — Z9289 Personal history of other medical treatment: Secondary | ICD-10-CM | POA: Diagnosis not present

## 2016-12-03 ENCOUNTER — Encounter: Payer: Self-pay | Admitting: Certified Nurse Midwife

## 2017-10-05 ENCOUNTER — Ambulatory Visit: Payer: BLUE CROSS/BLUE SHIELD | Admitting: Certified Nurse Midwife

## 2017-10-05 ENCOUNTER — Encounter: Payer: Self-pay | Admitting: Certified Nurse Midwife

## 2017-10-05 VITALS — BP 120/70 | HR 64 | Resp 16 | Ht 62.0 in | Wt 147.0 lb

## 2017-10-05 DIAGNOSIS — Z01419 Encounter for gynecological examination (general) (routine) without abnormal findings: Secondary | ICD-10-CM

## 2017-10-05 DIAGNOSIS — M25559 Pain in unspecified hip: Secondary | ICD-10-CM | POA: Insufficient documentation

## 2017-10-05 DIAGNOSIS — Z79890 Hormone replacement therapy (postmenopausal): Secondary | ICD-10-CM

## 2017-10-05 DIAGNOSIS — Z0000 Encounter for general adult medical examination without abnormal findings: Secondary | ICD-10-CM

## 2017-10-05 DIAGNOSIS — M545 Low back pain, unspecified: Secondary | ICD-10-CM | POA: Insufficient documentation

## 2017-10-05 DIAGNOSIS — N951 Menopausal and female climacteric states: Secondary | ICD-10-CM | POA: Diagnosis not present

## 2017-10-05 MED ORDER — CONJ ESTROG-MEDROXYPROGEST ACE 0.3-1.5 MG PO TABS: 1.0000 | ORAL_TABLET | Freq: Every day | ORAL | 12 refills | Status: DC

## 2017-10-05 NOTE — Patient Instructions (Signed)

## 2017-10-05 NOTE — Progress Notes (Signed)
56 y.o. 671P1001 Married  Caucasian Fe here for annual exam. Menopausal on HRT. No hot flashes or night sweats. Denies vaginal bleeding or dryness. Sees urgent care if needed, none in a long time. Labs here today. No health issues today. Busy with grandchild. Does not want to have colonoscopy yet. Interested in [employer].  Patient's last menstrual period was 08/30/2014 (exact date).          Sexually active: Yes.    The current method of family planning is post menopausal status.    Exercising: No.  exercise Smoker:  no  Health Maintenance: Pap:  09-18-14 neg, 09-30-16 neg History of Abnormal Pap: no MMG:  11-25-16 category c density birads 1:neg Self Breast exams: occ Colonoscopy:  none BMD:   none TDaP:  2015 Shingles: no Pneumonia: no Hep C and HIV: Hep c neg 2017 Labs: yes   reports that she has quit smoking. she has never used smokeless tobacco. She reports that she does not drink alcohol or use drugs.  History reviewed. No pertinent past medical history.  Past Surgical History:  Procedure Laterality Date  . CARPAL TUNNEL RELEASE Right     Current Outpatient Medications  Medication Sig Dispense Refill  . Cholecalciferol (VITAMIN D PO) Take 1,000 Units by mouth daily.     Marland Kitchen. estrogen, conjugated,-medroxyprogesterone (PREMPRO) 0.3-1.5 MG tablet Take 1 tablet by mouth daily. 30 tablet 12  . naproxen sodium (ANAPROX) 220 MG tablet Take 220 mg by mouth 2 (two) times daily with a meal.     No current facility-administered medications for this visit.     Family History  Problem Relation Age of Onset  . Hypertension Mother   . Thyroid disease Mother   . Diabetes Father   . Cancer Father [phone]       Dec metastatic Lung CA 2015prostate, Lung dx 06/26/14  . Hypertension Father   . Heart disease Father     ROS:  Pertinent items are noted in HPI.  Otherwise, a comprehensive ROS was negative.  Exam:   BP 120/70   Pulse 64   Resp 16   Ht 5\' 2"  (1.575 m)   Wt 147 lb (66.7 kg)    LMP 08/30/2014 (Exact Date)   BMI 26.89 kg/m  Height: 5\' 2"  (157.5 cm) Ht Readings from Last 3 Encounters:  10/05/17 5\' 2"  (1.575 m)  09/30/16 5' 2.25" (1.581 m)  02/27/16 5' 2.5" (1.588 m)    General appearance: alert, cooperative and appears stated age Head: Normocephalic, without obvious abnormality, atraumatic Neck: no adenopathy, supple, symmetrical, trachea midline and thyroid normal to inspection and palpation Lungs: clear to auscultation bilaterally Breasts: normal appearance, no masses or tenderness, No nipple retraction or dimpling, No nipple discharge or bleeding, No axillary or supraclavicular adenopathy Heart: regular rate and rhythm Abdomen: soft, non-tender; no masses,  no organomegaly Extremities: extremities normal, atraumatic, no cyanosis or edema Skin: Skin color, texture, turgor normal. No rashes or lesions Lymph nodes: Cervical, supraclavicular, and axillary nodes normal. No abnormal inguinal nodes palpated Neurologic: Grossly normal   Pelvic: External genitalia:  no lesions              Urethra:  normal appearing urethra with no masses, tenderness or lesions              Bartholin's and Skene's: normal                 Vagina: normal appearing vagina with normal color and discharge, no lesions  Cervix: anteverted, multiparous appearance, no cervical motion tenderness and no lesions              Pap taken: No. Bimanual Exam:  Uterus:  normal size, contour, position, consistency, mobility, non-tender              Adnexa: normal adnexa and no mass, fullness, tenderness               Rectovaginal: Confirms               Anus:  normal sphincter tone, no lesions  Chaperone present: yes  A:  Well Woman with normal exam  Menopausal on HRT desires continuance  Screening labs  Colonoscopy due, interested in Cologard   P:   Reviewed health and wellness pertinent to exam  Aware of need to advise if vaginal bleeding.  Rx Prempro see order with  instructions  Labs Lipid panel, CMP, TSH, Vitamin D  Patient will call for insurance benefits and will advise if she decides to do  Pap smear: no   counseled on breast self exam, mammography screening, menopause, adequate intake of calcium and vitamin D, diet and exercise  return annually or prn  An After Visit Summary was printed and given to the patient.

## 2017-10-06 ENCOUNTER — Telehealth: Payer: Self-pay

## 2017-10-06 ENCOUNTER — Other Ambulatory Visit: Payer: Self-pay | Admitting: Certified Nurse Midwife

## 2017-10-06 DIAGNOSIS — R899 Unspecified abnormal finding in specimens from other organs, systems and tissues: Secondary | ICD-10-CM

## 2017-10-06 LAB — COMPREHENSIVE METABOLIC PANEL
A/G RATIO: 1.8 (ref 1.2–2.2)
ALBUMIN: 4.6 g/dL (ref 3.5–5.5)
ALK PHOS: 72 IU/L (ref 39–117)
ALT: 12 IU/L (ref 0–32)
AST: 19 IU/L (ref 0–40)
BILIRUBIN TOTAL: 0.4 mg/dL (ref 0.0–1.2)
BUN / CREAT RATIO: 20 (ref 9–23)
BUN: 15 mg/dL (ref 6–24)
CHLORIDE: 98 mmol/L (ref 96–106)
CO2: 26 mmol/L (ref 20–29)
Calcium: 9.7 mg/dL (ref 8.7–10.2)
Creatinine, Ser: 0.75 mg/dL (ref 0.57–1.00)
GFR calc Af Amer: 105 mL/min/{1.73_m2} (ref >59)
GFR calc non Af Amer: 91 mL/min/{1.73_m2} (ref >59)
GLOBULIN, TOTAL: 2.6 g/dL (ref 1.5–4.5)
GLUCOSE: 83 mg/dL (ref 65–99)
POTASSIUM: 4.5 mmol/L (ref 3.5–5.2)
SODIUM: 137 mmol/L (ref 134–144)
Total Protein: 7.2 g/dL (ref 6.0–8.5)

## 2017-10-06 LAB — TSH: TSH: 6.42 u[IU]/mL — ABNORMAL HIGH (ref 0.450–4.500)

## 2017-10-06 LAB — LIPID PANEL
CHOLESTEROL TOTAL: 182 mg/dL (ref 100–199)
Chol/HDL Ratio: 2.8 ratio (ref 0.0–4.4)
HDL: 65 mg/dL (ref >39)
LDL Calculated: 98 mg/dL (ref 0–99)
Triglycerides: 95 mg/dL (ref 0–149)
VLDL Cholesterol Cal: 19 mg/dL (ref 5–40)

## 2017-10-06 LAB — VITAMIN D 25 HYDROXY (VIT D DEFICIENCY, FRACTURES): VIT D 25 HYDROXY: 63.2 ng/mL (ref 30.0–100.0)

## 2017-10-06 NOTE — Telephone Encounter (Signed)
-----   Message from Verner Choleborah S Leonard, CNM sent at 10/06/2017  7:46 AM EST ----- Notify patient that Liver, kidney and glucose profile is normal Lipid panel looks great all normal with cholesterol 182 and HDL at 65 TSH is elevated and will need recheck in 6 weeks order placed please schedule Vitamin D normal at 63.2, continue supplement daily

## 2017-10-06 NOTE — Telephone Encounter (Signed)
Patient notified of results. See lab 

## 2017-10-06 NOTE — Telephone Encounter (Signed)
lmtcb

## 2017-10-06 NOTE — Telephone Encounter (Signed)
Patient returning call.

## 2017-10-07 ENCOUNTER — Telehealth: Payer: Self-pay | Admitting: Certified Nurse Midwife

## 2017-10-07 NOTE — Telephone Encounter (Signed)
Mrs. Marcene BrawnDebbi, ok to proceed with Cologuard?   Cologuard order requisition to Leota Sauerseborah Leonard, CNM for signature.

## 2017-10-07 NOTE — Telephone Encounter (Signed)
Signed Cologuard order faxed to [facility].   Spoke with patient. Advised order for Cologuard faxed to [employer], they will contact you directly regarding benefits and cologuard testing process. Once cologuard is completed, Renee SauersDeborah Abbott, CNM will receive copy of results, you will be notified of results. Patient verbalizes understanding and is agreeable.   Routing to provider for final review. Patient is agreeable to disposition. Will close encounter.

## 2017-10-07 NOTE — Telephone Encounter (Signed)
Patient says her insurance will pay for the cologuard and she would like to proceed with it.

## 2017-10-24 ENCOUNTER — Other Ambulatory Visit: Payer: Self-pay | Admitting: Certified Nurse Midwife

## 2017-10-24 DIAGNOSIS — N951 Menopausal and female climacteric states: Secondary | ICD-10-CM

## 2017-11-03 DIAGNOSIS — Z1212 Encounter for screening for malignant neoplasm of rectum: Secondary | ICD-10-CM | POA: Diagnosis not present

## 2017-11-03 DIAGNOSIS — Z1211 Encounter for screening for malignant neoplasm of colon: Secondary | ICD-10-CM | POA: Diagnosis not present

## 2017-11-15 ENCOUNTER — Other Ambulatory Visit (INDEPENDENT_AMBULATORY_CARE_PROVIDER_SITE_OTHER): Payer: BLUE CROSS/BLUE SHIELD

## 2017-11-15 DIAGNOSIS — R899 Unspecified abnormal finding in specimens from other organs, systems and tissues: Secondary | ICD-10-CM

## 2017-11-16 ENCOUNTER — Telehealth: Payer: Self-pay | Admitting: Certified Nurse Midwife

## 2017-11-16 ENCOUNTER — Telehealth: Payer: Self-pay | Admitting: *Deleted

## 2017-11-16 DIAGNOSIS — Z1212 Encounter for screening for malignant neoplasm of rectum: Secondary | ICD-10-CM

## 2017-11-16 DIAGNOSIS — Z1211 Encounter for screening for malignant neoplasm of colon: Secondary | ICD-10-CM

## 2017-11-16 DIAGNOSIS — Z1213 Encounter for screening for malignant neoplasm of small intestine: Secondary | ICD-10-CM

## 2017-11-16 DIAGNOSIS — R195 Other fecal abnormalities: Secondary | ICD-10-CM

## 2017-11-16 LAB — THYROID PANEL WITH TSH
Free Thyroxine Index: 1.7 (ref 1.2–4.9)
T3 Uptake Ratio: 27 % (ref 24–39)
T4, Total: 6.4 ug/dL (ref 4.5–12.0)
TSH: 4.03 u[IU]/mL (ref 0.450–4.500)

## 2017-11-16 LAB — COLOGUARD: Cologuard: POSITIVE

## 2017-11-16 NOTE — Telephone Encounter (Addendum)
Spoke with patient, advised Cologuard was positive and referral recommended to Dr. Loreta AveMann for colonoscopy. Advised patient referral placed, our referral coordinator will f/u with scheduling. Patient request copy of cologuard results be mailed, address on file confirmed. Patient verbalizes understanding and is agreeable.   Cologuard results entered in Epic.  Copy of results to scan and copy mailed to patient.   Routing to provider for final review. Patient is agreeable to disposition. Will close encounter.   Cc: Harland DingwallSuzy Dixon

## 2017-11-16 NOTE — Telephone Encounter (Signed)
Kim from [facility] called to see if our office received this patient's Cologard results they faxed to us last Friday, 11/12/17.

## 2017-11-16 NOTE — Telephone Encounter (Signed)
Routing to Arnold LongEmily Caldwell, RN for review.

## 2017-11-16 NOTE — Telephone Encounter (Signed)
Spoke with Deanna ArtisKeisha at [facility] and advised that our office had received Cologuard results for patient.   Routing to provider for final review. Patient agreeable to disposition. Will close encounter.

## 2017-11-29 DIAGNOSIS — Z1231 Encounter for screening mammogram for malignant neoplasm of breast: Secondary | ICD-10-CM | POA: Diagnosis not present

## 2017-12-16 DIAGNOSIS — Z1211 Encounter for screening for malignant neoplasm of colon: Secondary | ICD-10-CM | POA: Diagnosis not present

## 2017-12-16 DIAGNOSIS — R195 Other fecal abnormalities: Secondary | ICD-10-CM | POA: Diagnosis not present

## 2017-12-16 DIAGNOSIS — K5904 Chronic idiopathic constipation: Secondary | ICD-10-CM | POA: Diagnosis not present

## 2018-01-03 DIAGNOSIS — J069 Acute upper respiratory infection, unspecified: Secondary | ICD-10-CM | POA: Diagnosis not present

## 2018-01-26 ENCOUNTER — Encounter: Payer: Self-pay | Admitting: Certified Nurse Midwife

## 2018-01-26 DIAGNOSIS — Z1211 Encounter for screening for malignant neoplasm of colon: Secondary | ICD-10-CM | POA: Diagnosis not present

## 2018-01-26 DIAGNOSIS — D122 Benign neoplasm of ascending colon: Secondary | ICD-10-CM | POA: Diagnosis not present

## 2018-01-26 DIAGNOSIS — R195 Other fecal abnormalities: Secondary | ICD-10-CM | POA: Diagnosis not present

## 2018-01-26 DIAGNOSIS — K635 Polyp of colon: Secondary | ICD-10-CM | POA: Diagnosis not present

## 2018-02-10 ENCOUNTER — Telehealth: Payer: Self-pay | Admitting: Certified Nurse Midwife

## 2018-02-10 NOTE — Telephone Encounter (Signed)
Spoke with patient. Patient states that she had spotting for 3 days 1 month ago when she had a really bad cold with cough. Contributed bleeding to coughing very hard. This morning she woke up and has started spotting again. Spotting is dark brown and heavier than last time. Wearing a panty liner and notices spotting with wiping. Denies any heavy bleeding or pain. Advised will need to be seen for further evaluation. Patient is agreeable. Declines all appointments this week. Appointment scheduled for 02/15/2018 at 2 pm with Dr.Jertson. Patient is agreeable to date and time. Patient is aware she will need to be seen earlier if bleeding increases or develops new symptoms.  Routing to provider for final review. Patient agreeable to disposition. Will close encounter.

## 2018-02-10 NOTE — Telephone Encounter (Signed)
Patient has started spotting. 

## 2018-02-15 ENCOUNTER — Ambulatory Visit: Payer: BLUE CROSS/BLUE SHIELD | Admitting: Obstetrics and Gynecology

## 2018-02-15 ENCOUNTER — Other Ambulatory Visit: Payer: Self-pay | Admitting: *Deleted

## 2018-02-15 ENCOUNTER — Encounter: Payer: Self-pay | Admitting: Obstetrics and Gynecology

## 2018-02-15 ENCOUNTER — Other Ambulatory Visit (HOSPITAL_COMMUNITY)
Admission: RE | Admit: 2018-02-15 | Discharge: 2018-02-15 | Disposition: A | Discharge status: Home / Self Care | Payer: BLUE CROSS/BLUE SHIELD | Source: Ambulatory Visit | Attending: Obstetrics and Gynecology | Admitting: Obstetrics and Gynecology

## 2018-02-15 ENCOUNTER — Other Ambulatory Visit: Payer: Self-pay

## 2018-02-15 VITALS — BP 132/80 | HR 76 | Resp 12 | Wt 148.0 lb

## 2018-02-15 DIAGNOSIS — N950 Postmenopausal bleeding: Secondary | ICD-10-CM | POA: Insufficient documentation

## 2018-02-15 DIAGNOSIS — E010 Iodine-deficiency related diffuse (endemic) goiter: Secondary | ICD-10-CM

## 2018-02-15 DIAGNOSIS — Z01419 Encounter for gynecological examination (general) (routine) without abnormal findings: Secondary | ICD-10-CM | POA: Diagnosis not present

## 2018-02-15 DIAGNOSIS — Z79890 Hormone replacement therapy (postmenopausal): Secondary | ICD-10-CM

## 2018-02-15 NOTE — Progress Notes (Signed)
GYNECOLOGY  VISIT   HPI: 57 y.o.   Married  Caucasian  female   G1P1001 with Patient's last menstrual period was 08/30/2014 (exact date).   here c/o postmenopause vaginal spotting on and off X 2 months. The spotting is brown. Not recently sexually active. The first time she noticed it she had a really bad cough. It persisted after the cough improved. Last bleeding was 5 days ago. No abdominal pain. No urinary or bowel c/o.  She is on daily hrt.   GYNECOLOGIC HISTORY: Patient's last menstrual period was 08/30/2014 (exact date). Contraception:postmenopause Menopausal hormone therapy: none         OB History    Gravida Para Term Preterm AB Living   1 1 1     1    SAB TAB Ectopic Multiple Live Births           1         Patient Active Problem List   Diagnosis Date Noted  . LBP (low back pain) 10/05/2017  . Arthralgia of hip 10/05/2017    History reviewed. No pertinent past medical history.  Past Surgical History:  Procedure Laterality Date  . CARPAL TUNNEL RELEASE Right     Current Outpatient Medications  Medication Sig Dispense Refill  . Cholecalciferol (VITAMIN D PO) Take 1,000 Units by mouth daily.     Marland Kitchen estrogen, conjugated,-medroxyprogesterone (PREMPRO) 0.3-1.5 MG tablet Take 1 tablet daily by mouth. 30 tablet 12  . naproxen sodium (ANAPROX) 220 MG tablet Take 220 mg by mouth 2 (two) times daily with a meal.     No current facility-administered medications for this visit.      ALLERGIES: Patient has no known allergies.  Family History  Problem Relation Age of Onset  . Hypertension Mother   . Thyroid disease Mother   . Diabetes Father   . Cancer Father 63       Dec metastatic Lung CA 2015prostate, Lung dx 06/26/14  . Hypertension Father   . Heart disease Father     Social History   Socioeconomic History  . Marital status: Married    Spouse name: Not on file  . Number of children: Not on file  . Years of education: Not on file  . Highest education level:  Not on file  Social Needs  . Financial resource strain: Not on file  . Food insecurity - worry: Not on file  . Food insecurity - inability: Not on file  . Transportation needs - medical: Not on file  . Transportation needs - non-medical: Not on file  Occupational History  . Not on file  Tobacco Use  . Smoking status: Former Games developer  . Smokeless tobacco: Never Used  Substance and Sexual Activity  . Alcohol use: No    Alcohol/week: 0.0 oz  . Drug use: No  . Sexual activity: Yes    Partners: Male    Comment: husband vasectomy  Other Topics Concern  . Not on file  Social History Narrative  . Not on file    Review of Systems  Constitutional: Negative.   HENT: Negative.   Eyes: Negative.   Respiratory: Negative.   Cardiovascular: Negative.   Gastrointestinal: Negative.   Genitourinary:       Postmenopause spotting   Musculoskeletal: Negative.   Skin: Negative.   Neurological: Negative.   Endo/Heme/Allergies: Negative.   Psychiatric/Behavioral: Negative.     PHYSICAL EXAMINATION:    BP 132/80 (BP Location: Right Arm, Patient Position: Sitting, Cuff Size: Normal)  Pulse 76   Resp 12   Wt 148 lb (67.1 kg)   LMP 08/30/2014 (Exact Date)   BMI 27.07 kg/m     General appearance: alert, cooperative and appears stated age Neck: no adenopathy, supple, symmetrical, trachea midline and thyroid right lobe>left, concerning for nodule Abdomen: soft, non-tender; non distended, no masses,  no organomegaly  Pelvic: External genitalia:  no lesions              Urethra:  normal appearing urethra with no masses, tenderness or lesions              Bartholins and Skenes: normal                 Vagina: normal appearing vagina with normal color and discharge, no lesions              Cervix: no lesions              Bimanual Exam:  Uterus:  normal size, contour, position, consistency, mobility, non-tender and anteverted              Adnexa: no mass, fullness, tenderness               The  risks of endometrial biopsy were reviewed and a consent was obtained.  A speculum was placed in the vagina and the cervix was cleansed with betadine. A  mini-pipelle was placed into the endometrial cavity. The uterus sounded to 7 cm. The endometrial biopsy was performed, minimal tissue was obtained. The speculum was removed. There were no complications.    Chaperone was present for exam.  ASSESSMENT Postmenopausal bleeding On HRT Right lobe of thyroid enlarged compared to left, concerning for nodule   PLAN Endometrial biopsy done Pap done with reflex hpv testing Return for ultrasound, possible sonohysterogram Thyroid ultrasound (recent normal TFT's, TSH high normal)   An After Visit Summary was printed and given to the patient.  CC: Sara Chuebbie Leonard, CNM

## 2018-02-15 NOTE — Patient Instructions (Signed)

## 2018-02-18 LAB — CYTOLOGY - PAP
DIAGNOSIS: UNDETERMINED — AB
HPV: NOT DETECTED

## 2018-02-24 ENCOUNTER — Ambulatory Visit
Admission: RE | Admit: 2018-02-24 | Discharge: 2018-02-24 | Disposition: A | Discharge status: Home / Self Care | Payer: BLUE CROSS/BLUE SHIELD | Source: Ambulatory Visit | Attending: Obstetrics and Gynecology | Admitting: Obstetrics and Gynecology

## 2018-02-24 DIAGNOSIS — E010 Iodine-deficiency related diffuse (endemic) goiter: Secondary | ICD-10-CM

## 2018-02-24 DIAGNOSIS — E042 Nontoxic multinodular goiter: Secondary | ICD-10-CM | POA: Diagnosis not present

## 2018-03-01 ENCOUNTER — Ambulatory Visit (INDEPENDENT_AMBULATORY_CARE_PROVIDER_SITE_OTHER): Payer: BLUE CROSS/BLUE SHIELD

## 2018-03-01 ENCOUNTER — Other Ambulatory Visit: Payer: Self-pay | Admitting: Obstetrics and Gynecology

## 2018-03-01 ENCOUNTER — Ambulatory Visit (INDEPENDENT_AMBULATORY_CARE_PROVIDER_SITE_OTHER): Payer: BLUE CROSS/BLUE SHIELD | Admitting: Obstetrics and Gynecology

## 2018-03-01 ENCOUNTER — Other Ambulatory Visit: Payer: Self-pay | Admitting: *Deleted

## 2018-03-01 ENCOUNTER — Encounter: Payer: Self-pay | Admitting: Obstetrics and Gynecology

## 2018-03-01 ENCOUNTER — Other Ambulatory Visit: Payer: Self-pay

## 2018-03-01 VITALS — BP 152/86 | HR 64 | Resp 14 | Wt 148.0 lb

## 2018-03-01 DIAGNOSIS — Z79890 Hormone replacement therapy (postmenopausal): Secondary | ICD-10-CM

## 2018-03-01 DIAGNOSIS — N950 Postmenopausal bleeding: Secondary | ICD-10-CM | POA: Diagnosis not present

## 2018-03-01 DIAGNOSIS — E041 Nontoxic single thyroid nodule: Secondary | ICD-10-CM | POA: Diagnosis not present

## 2018-03-01 DIAGNOSIS — E042 Nontoxic multinodular goiter: Secondary | ICD-10-CM

## 2018-03-01 NOTE — Progress Notes (Addendum)
GYNECOLOGY  VISIT   HPI: 57 y.o.   Married  Caucasian  female   G1P1001 with Patient's last menstrual period was 08/30/2014 (exact date).   here for follow up PMB on HRT. She has a 2 month h/o intermittent vaginal spotting. Endometrial biopsy with proliferative endometrium, pap ASCUS, negative HPV.  She was just diagnosed with a thyroid nodule, results of her ultrasound were discussed. A F/U ultrasound will be set up for one year from now.  GYNECOLOGIC HISTORY: Patient's last menstrual period was 08/30/2014 (exact date). Contraception:postmenopause  Menopausal hormone therapy: none         OB History    Gravida  1   Para  1   Term  1   Preterm      AB      Living  1     SAB      TAB      Ectopic      Multiple      Live Births  1              Patient Active Problem List   Diagnosis Date Noted  . LBP (low back pain) 10/05/2017  . Arthralgia of hip 10/05/2017    History reviewed. No pertinent past medical history.  Past Surgical History:  Procedure Laterality Date  . CARPAL TUNNEL RELEASE Right     Current Outpatient Medications  Medication Sig Dispense Refill  . Cholecalciferol (VITAMIN D PO) Take 1,000 Units by mouth daily.     Marland Kitchen estrogen, conjugated,-medroxyprogesterone (PREMPRO) 0.3-1.5 MG tablet Take 1 tablet daily by mouth. 30 tablet 12  . naproxen sodium (ANAPROX) 220 MG tablet Take 220 mg by mouth 2 (two) times daily with a meal.     No current facility-administered medications for this visit.      ALLERGIES: Patient has no known allergies.  Family History  Problem Relation Age of Onset  . Hypertension Mother   . Thyroid disease Mother   . Diabetes Father   . Cancer Father 74       Dec metastatic Lung CA 2015prostate, Lung dx 06/26/14  . Hypertension Father   . Heart disease Father     Social History   Socioeconomic History  . Marital status: Married    Spouse name: Not on file  . Number of children: Not on file  . Years  of education: Not on file  . Highest education level: Not on file  Occupational History  . Not on file  Social Needs  . Financial resource strain: Not on file  . Food insecurity:    Worry: Not on file    Inability: Not on file  . Transportation needs:    Medical: Not on file    Non-medical: Not on file  Tobacco Use  . Smoking status: Former Games developer  . Smokeless tobacco: Never Used  Substance and Sexual Activity  . Alcohol use: No    Alcohol/week: 0.0 oz  . Drug use: No  . Sexual activity: Yes    Partners: Male    Comment: husband vasectomy  Lifestyle  . Physical activity:    Days per week: Not on file    Minutes per session: Not on file  . Stress: Not on file  Relationships  . Social connections:    Talks on phone: Not on file    Gets together: Not on file    Attends religious service: Not on file    Active member of club or  organization: Not on file    Attends meetings of clubs or organizations: Not on file    Relationship status: Not on file  . Intimate partner violence:    Fear of current or ex partner: Not on file    Emotionally abused: Not on file    Physically abused: Not on file    Forced sexual activity: Not on file  Other Topics Concern  . Not on file  Social History Narrative  . Not on file    Review of Systems  Constitutional: Negative.   HENT: Negative.   Eyes: Negative.   Respiratory: Negative.   Cardiovascular: Negative.   Gastrointestinal: Negative.   Genitourinary: Negative.   Musculoskeletal: Negative.   Skin: Negative.   Neurological: Negative.   Endo/Heme/Allergies: Negative.   Psychiatric/Behavioral: Negative.     PHYSICAL EXAMINATION:    BP (!) 152/86 (BP Location: Right Arm, Patient Position: Sitting, Cuff Size: Normal)   Pulse 64   Resp 14   Wt 148 lb (67.1 kg)   LMP 08/30/2014 (Exact Date)   BMI 27.07 kg/m     General appearance: alert, cooperative and appears stated age Neck: no adenopathy, supple, symmetrical, trachea  midline and thyroid right lobe>left lobe, cw nodule Heart: regular rate and rhythm Lungs: CTAB Abdomen: soft, non-tender; bowel sounds normal; no masses,  no organomegaly Extremities: normal, atraumatic, no cyanosis Skin: normal color, texture and turgor, no rashes or lesions Lymph: normal cervical supraclavicular and inguinal nodes Neurologic: grossly normal   Pelvic: External genitalia:  no lesions              Urethra:  normal appearing urethra with no masses, tenderness or lesions              Bartholins and Skenes: normal                 Vagina: normal appearing vagina with normal color and discharge, no lesions              Cervix: no lesions  Sonohysterogram The procedure and risks of the procedure were reviewed with the patient, consent form was signed. A speculum was placed in the vagina and the cervix was cleansed with Hibiclens. A tenaculum was placed on the cervix. The sonohysterogram catheter was inserted into the uterine cavity without difficulty. The tenaculum and speculum were removed. Saline was infused under direct observation with the ultrasound. An intracavitary defect was seen at the fundus, suspicious for endometrial polyp.The catheter was removed.    Chaperone was present for exam.  Ultrasound images were reviewed with the patient  ASSESSMENT PMP bleeding, proliferative endometrium on biopsy, likely polyp on sonohysterogram  On HRT Thyroid nodules on ultrasound last week, reviewed results and recommendation for f/u ultrasound in one year. Normal TFT's    PLAN FSH Plan: hysteroscopy, possible polypectomy, dilation and curettage. Reviewed risks, including: bleeding, infection, uterine perforation, fluid overload, need for further sugery F/U thyroid u/s in one year    An After Visit Summary was printed and given to the patient.  CC: Sara Chuebbie Leonard, CNM  Addendum: she has an ovarian cyst, [facility], will get another  ultrasound in 3 months for f/u

## 2018-03-02 LAB — FOLLICLE STIMULATING HORMONE: FSH: 75.5 m[IU]/mL

## 2018-03-04 ENCOUNTER — Other Ambulatory Visit: Payer: Self-pay | Admitting: *Deleted

## 2018-03-04 DIAGNOSIS — N83209 Unspecified ovarian cyst, unspecified side: Secondary | ICD-10-CM

## 2018-03-09 LAB — SPECIMEN STATUS REPORT

## 2018-03-09 LAB — CA 125: CANCER ANTIGEN (CA) 125: 23.3 U/mL (ref 0.0–38.1)

## 2018-03-10 ENCOUNTER — Telehealth: Payer: Self-pay | Admitting: Obstetrics and Gynecology

## 2018-03-10 NOTE — Telephone Encounter (Signed)
Spoke with patient regarding benefit for recommended surgery. Patient understood and agreeable. Patient has confirmed and is ready to schedule. Patient aware this is professional benefit only. Patient aware will be contacted by hospital for separate benefits. Forwarding to Nurse Supervisor for scheduling ° °Routing to Sally Yeakley, RN °

## 2018-03-11 NOTE — Telephone Encounter (Signed)
Call to patient to discuss surgery date options. Brief review of post op expectations and return to work.  Agreeable to proceed with scheduling. Will call back with date once confirmed.

## 2018-03-11 NOTE — Telephone Encounter (Signed)
Call to patient. Advised [facility] Surgery instruction sheet reviewed and printed copy will be mailed with surgery center brochure.   Routing to provider for final review. Patient agreeable to disposition. Will close encounter.

## 2018-03-18 NOTE — H&P (Signed)
   GYNECOLOGY  VISIT   HPI: 56 y.o.   Married  Caucasian  female   G1P1001 with Patient's last menstrual period was 08/30/2014 (exact date).   here for follow up PMB on HRT. She has a 2 month h/o intermittent vaginal spotting. Endometrial biopsy with proliferative endometrium, pap ASCUS, negative HPV.  She was just diagnosed with a thyroid nodule, results of her ultrasound were discussed. A F/U ultrasound will be set up for one year from now.  GYNECOLOGIC HISTORY: Patient's last menstrual period was 08/30/2014 (exact date). Contraception:postmenopause  Menopausal hormone therapy: none         OB History    Gravida  1   Para  1   Term  1   Preterm      AB      Living  1     SAB      TAB      Ectopic      Multiple      Live Births  1              Patient Active Problem List   Diagnosis Date Noted  . LBP (low back pain) 10/05/2017  . Arthralgia of hip 10/05/2017    History reviewed. No pertinent past medical history.  Past Surgical History:  Procedure Laterality Date  . CARPAL TUNNEL RELEASE Right     Current Outpatient Medications  Medication Sig Dispense Refill  . Cholecalciferol (VITAMIN D PO) Take 1,000 Units by mouth daily.     . estrogen, conjugated,-medroxyprogesterone (PREMPRO) 0.3-1.5 MG tablet Take 1 tablet daily by mouth. 30 tablet 12  . naproxen sodium (ANAPROX) 220 MG tablet Take 220 mg by mouth 2 (two) times daily with a meal.     No current facility-administered medications for this visit.      ALLERGIES: Patient has no known allergies.  Family History  Problem Relation Age of Onset  . Hypertension Mother   . Thyroid disease Mother   . Diabetes Father   . Cancer Father 73       Dec metastatic Lung CA 2015prostate, Lung dx 06/26/14  . Hypertension Father   . Heart disease Father     Social History   Socioeconomic History  . Marital status: Married    Spouse name: Not on file  . Number of children: Not on file  . Years  of education: Not on file  . Highest education level: Not on file  Occupational History  . Not on file  Social Needs  . Financial resource strain: Not on file  . Food insecurity:    Worry: Not on file    Inability: Not on file  . Transportation needs:    Medical: Not on file    Non-medical: Not on file  Tobacco Use  . Smoking status: Former Smoker  . Smokeless tobacco: Never Used  Substance and Sexual Activity  . Alcohol use: No    Alcohol/week: 0.0 oz  . Drug use: No  . Sexual activity: Yes    Partners: Male    Comment: husband vasectomy  Lifestyle  . Physical activity:    Days per week: Not on file    Minutes per session: Not on file  . Stress: Not on file  Relationships  . Social connections:    Talks on phone: Not on file    Gets together: Not on file    Attends religious service: Not on file    Active member of club or   organization: Not on file    Attends meetings of clubs or organizations: Not on file    Relationship status: Not on file  . Intimate partner violence:    Fear of current or ex partner: Not on file    Emotionally abused: Not on file    Physically abused: Not on file    Forced sexual activity: Not on file  Other Topics Concern  . Not on file  Social History Narrative  . Not on file    Review of Systems  Constitutional: Negative.   HENT: Negative.   Eyes: Negative.   Respiratory: Negative.   Cardiovascular: Negative.   Gastrointestinal: Negative.   Genitourinary: Negative.   Musculoskeletal: Negative.   Skin: Negative.   Neurological: Negative.   Endo/Heme/Allergies: Negative.   Psychiatric/Behavioral: Negative.     PHYSICAL EXAMINATION:    BP (!) 152/86 (BP Location: Right Arm, Patient Position: Sitting, Cuff Size: Normal)   Pulse 64   Resp 14   Wt 148 lb (67.1 kg)   LMP 08/30/2014 (Exact Date)   BMI 27.07 kg/m     General appearance: alert, cooperative and appears stated age Neck: no adenopathy, supple, symmetrical, trachea  midline and thyroid right lobe>left lobe, cw nodule Heart: regular rate and rhythm Lungs: CTAB Abdomen: soft, non-tender; bowel sounds normal; no masses,  no organomegaly Extremities: normal, atraumatic, no cyanosis Skin: normal color, texture and turgor, no rashes or lesions Lymph: normal cervical supraclavicular and inguinal nodes Neurologic: grossly normal   Pelvic: External genitalia:  no lesions              Urethra:  normal appearing urethra with no masses, tenderness or lesions              Bartholins and Skenes: normal                 Vagina: normal appearing vagina with normal color and discharge, no lesions              Cervix: no lesions  Sonohysterogram The procedure and risks of the procedure were reviewed with the patient, consent form was signed. A speculum was placed in the vagina and the cervix was cleansed with Hibiclens. A tenaculum was placed on the cervix. The sonohysterogram catheter was inserted into the uterine cavity without difficulty. The tenaculum and speculum were removed. Saline was infused under direct observation with the ultrasound. An intracavitary defect was seen at the fundus, suspicious for endometrial polyp.The catheter was removed.    Chaperone was present for exam.  Ultrasound images were reviewed with the patient  ASSESSMENT PMP bleeding, proliferative endometrium on biopsy, likely polyp on sonohysterogram  On HRT Thyroid nodules on ultrasound last week, reviewed results and recommendation for f/u ultrasound in one year. Normal TFT's    PLAN FSH Plan: hysteroscopy, possible polypectomy, dilation and curettage. Reviewed risks, including: bleeding, infection, uterine perforation, fluid overload, need for further sugery F/U thyroid u/s in one year    An After Visit Summary was printed and given to the patient.  CC: Debbie Leonard, CNM  Addendum: she has an ovarian cyst, if her FSH is in the postmenopausal range, will get another  ultrasound in 3 months for f/u   

## 2018-03-23 ENCOUNTER — Encounter (HOSPITAL_BASED_OUTPATIENT_CLINIC_OR_DEPARTMENT_OTHER): Payer: Self-pay | Admitting: *Deleted

## 2018-03-23 ENCOUNTER — Other Ambulatory Visit: Payer: Self-pay

## 2018-03-23 NOTE — Progress Notes (Signed)
SPOKE W/ PT VIA PHONE FOR PRE-OP INTERVIEW.  NPO AFTER MN.  ARRIVE AT [id number].  WILL TAKE PREMPRO AM DOS W/ SIPS OF WATER.

## 2018-03-29 ENCOUNTER — Other Ambulatory Visit: Payer: Self-pay

## 2018-03-29 ENCOUNTER — Encounter (HOSPITAL_BASED_OUTPATIENT_CLINIC_OR_DEPARTMENT_OTHER): Payer: Self-pay | Admitting: Anesthesiology

## 2018-03-29 ENCOUNTER — Ambulatory Visit (HOSPITAL_BASED_OUTPATIENT_CLINIC_OR_DEPARTMENT_OTHER): Payer: BLUE CROSS/BLUE SHIELD | Admitting: Anesthesiology

## 2018-03-29 ENCOUNTER — Ambulatory Visit (HOSPITAL_BASED_OUTPATIENT_CLINIC_OR_DEPARTMENT_OTHER)
Admission: RE | Admit: 2018-03-29 | Discharge: 2018-03-29 | Disposition: A | Discharge status: Home / Self Care | Payer: BLUE CROSS/BLUE SHIELD | Source: Ambulatory Visit | Attending: Obstetrics and Gynecology | Admitting: Obstetrics and Gynecology

## 2018-03-29 ENCOUNTER — Encounter (HOSPITAL_BASED_OUTPATIENT_CLINIC_OR_DEPARTMENT_OTHER)
Admission: RE | Disposition: A | Discharge status: Home / Self Care | Payer: Self-pay | Source: Ambulatory Visit | Attending: Obstetrics and Gynecology

## 2018-03-29 DIAGNOSIS — Z8249 Family history of ischemic heart disease and other diseases of the circulatory system: Secondary | ICD-10-CM | POA: Diagnosis not present

## 2018-03-29 DIAGNOSIS — Z79890 Hormone replacement therapy (postmenopausal): Secondary | ICD-10-CM | POA: Insufficient documentation

## 2018-03-29 DIAGNOSIS — Z791 Long term (current) use of non-steroidal anti-inflammatories (NSAID): Secondary | ICD-10-CM | POA: Diagnosis not present

## 2018-03-29 DIAGNOSIS — N950 Postmenopausal bleeding: Secondary | ICD-10-CM | POA: Insufficient documentation

## 2018-03-29 DIAGNOSIS — N840 Polyp of corpus uteri: Secondary | ICD-10-CM | POA: Diagnosis not present

## 2018-03-29 DIAGNOSIS — Z87891 Personal history of nicotine dependence: Secondary | ICD-10-CM | POA: Diagnosis not present

## 2018-03-29 HISTORY — DX: Presence of spectacles and contact lenses: Z97.3

## 2018-03-29 HISTORY — PX: DILATATION & CURETTAGE/HYSTEROSCOPY WITH MYOSURE: SHX6511

## 2018-03-29 HISTORY — DX: Nontoxic multinodular goiter: E04.2

## 2018-03-29 HISTORY — DX: Postmenopausal bleeding: N95.0

## 2018-03-29 SURGERY — DILATATION & CURETTAGE/HYSTEROSCOPY WITH MYOSURE
Anesthesia: General | Site: Vagina

## 2018-03-29 MED ORDER — ACETAMINOPHEN 500 MG PO TABS
1000.0000 mg | ORAL_TABLET | Freq: Once | ORAL | Status: AC
  Administered 2018-03-29: 1000 mg via ORAL
  Filled 2018-03-29: qty 2

## 2018-03-29 MED ORDER — LACTATED RINGERS IV SOLN
INTRAVENOUS | Status: DC
  Filled 2018-03-29: qty 1000

## 2018-03-29 MED ORDER — MIDAZOLAM HCL 2 MG/2ML IJ SOLN
INTRAMUSCULAR | Status: DC | PRN
  Administered 2018-03-29: 2 mg via INTRAVENOUS

## 2018-03-29 MED ORDER — PROMETHAZINE HCL 25 MG/ML IJ SOLN
6.2500 mg | INTRAMUSCULAR | Status: DC | PRN
  Filled 2018-03-29: qty 1

## 2018-03-29 MED ORDER — LIDOCAINE 2% (20 MG/ML) 5 ML SYRINGE
INTRAMUSCULAR | Status: DC | PRN
  Administered 2018-03-29: 60 mg via INTRAVENOUS

## 2018-03-29 MED ORDER — CELECOXIB 200 MG PO CAPS
ORAL_CAPSULE | ORAL | Status: AC
  Filled 2018-03-29: qty 1

## 2018-03-29 MED ORDER — SODIUM CHLORIDE 0.9 % IV SOLN
INTRAVENOUS | Status: AC | PRN
  Administered 2018-03-29: 1000 mL

## 2018-03-29 MED ORDER — OXYCODONE HCL 5 MG/5ML PO SOLN
5.0000 mg | Freq: Once | ORAL | Status: DC | PRN
  Filled 2018-03-29: qty 5

## 2018-03-29 MED ORDER — LACTATED RINGERS IV SOLN
INTRAVENOUS | Status: DC
  Administered 2018-03-29: 10:00:00 via INTRAVENOUS
  Filled 2018-03-29: qty 1000

## 2018-03-29 MED ORDER — FENTANYL CITRATE (PF) 100 MCG/2ML IJ SOLN
INTRAMUSCULAR | Status: DC | PRN
  Administered 2018-03-29: 100 ug via INTRAVENOUS

## 2018-03-29 MED ORDER — HYDROMORPHONE HCL 1 MG/ML IJ SOLN
0.2500 mg | INTRAMUSCULAR | Status: DC | PRN
  Filled 2018-03-29: qty 0.5

## 2018-03-29 MED ORDER — CELECOXIB 200 MG PO CAPS
200.0000 mg | ORAL_CAPSULE | Freq: Once | ORAL | Status: AC | PRN
  Administered 2018-03-29: 200 mg via ORAL
  Filled 2018-03-29: qty 1

## 2018-03-29 MED ORDER — FENTANYL CITRATE (PF) 100 MCG/2ML IJ SOLN
INTRAMUSCULAR | Status: AC
  Filled 2018-03-29: qty 2

## 2018-03-29 MED ORDER — PROPOFOL 10 MG/ML IV BOLUS
INTRAVENOUS | Status: DC | PRN
  Administered 2018-03-29: 50 mg via INTRAVENOUS
  Administered 2018-03-29: 150 mg via INTRAVENOUS

## 2018-03-29 MED ORDER — ACETAMINOPHEN 160 MG/5ML PO SOLN
960.0000 mg | Freq: Once | ORAL | Status: AC
  Filled 2018-03-29: qty 40.6

## 2018-03-29 MED ORDER — ONDANSETRON HCL 4 MG/2ML IJ SOLN
INTRAMUSCULAR | Status: DC | PRN
  Administered 2018-03-29: 4 mg via INTRAVENOUS

## 2018-03-29 MED ORDER — ACETAMINOPHEN 500 MG PO TABS
ORAL_TABLET | ORAL | Status: AC
  Filled 2018-03-29: qty 2

## 2018-03-29 MED ORDER — ONDANSETRON HCL 4 MG/2ML IJ SOLN
INTRAMUSCULAR | Status: AC
  Filled 2018-03-29: qty 2

## 2018-03-29 MED ORDER — DEXAMETHASONE SODIUM PHOSPHATE 10 MG/ML IJ SOLN
INTRAMUSCULAR | Status: DC | PRN
  Administered 2018-03-29: 10 mg via INTRAVENOUS

## 2018-03-29 MED ORDER — MIDAZOLAM HCL 2 MG/2ML IJ SOLN
INTRAMUSCULAR | Status: AC
Start: 2018-03-29 — End: 2018-03-29
  Filled 2018-03-29: qty 2

## 2018-03-29 MED ORDER — PROPOFOL 10 MG/ML IV BOLUS
INTRAVENOUS | Status: AC
  Filled 2018-03-29: qty 20

## 2018-03-29 MED ORDER — OXYCODONE HCL 5 MG PO TABS
5.0000 mg | ORAL_TABLET | Freq: Once | ORAL | Status: DC | PRN
  Filled 2018-03-29: qty 1

## 2018-03-29 SURGICAL SUPPLY — 22 items
CANISTER SUCT 3000ML PPV (MISCELLANEOUS) ×4 IMPLANT
CATH ROBINSON RED A/P 16FR (CATHETERS) IMPLANT
COUNTER NEEDLE 1200 MAGNETIC (NEEDLE) IMPLANT
DEVICE MYOSURE LITE (MISCELLANEOUS) IMPLANT
DEVICE MYOSURE REACH (MISCELLANEOUS) ×2 IMPLANT
DILATOR CANAL MILEX (MISCELLANEOUS) IMPLANT
FILTER ARTHROSCOPY CONVERTOR (FILTER) ×2 IMPLANT
GLOVE BIO SURGEON STRL SZ 6.5 (GLOVE) ×2 IMPLANT
GOWN STRL REUS W/TWL LRG LVL3 (GOWN DISPOSABLE) ×2 IMPLANT
IV NS IRRIG 3000ML ARTHROMATIC (IV SOLUTION) ×2 IMPLANT
KIT TURNOVER CYSTO (KITS) ×2 IMPLANT
MYOSURE XL FIBROID REM (MISCELLANEOUS)
PACK VAGINAL MINOR WOMEN LF (CUSTOM PROCEDURE TRAY) ×2 IMPLANT
PAD OB MATERNITY 4.3X12.25 (PERSONAL CARE ITEMS) ×2 IMPLANT
PAD PREP 24X48 CUFFED NSTRL (MISCELLANEOUS) ×2 IMPLANT
SEAL ROD LENS SCOPE MYOSURE (ABLATOR) ×2 IMPLANT
SYR 20CC LL (SYRINGE) ×2 IMPLANT
SYSTEM TISS REMOVAL MYSR XL RM (MISCELLANEOUS) IMPLANT
TOWEL OR 17X24 6PK STRL BLUE (TOWEL DISPOSABLE) ×4 IMPLANT
TUBING AQUILEX INFLOW (TUBING) ×2 IMPLANT
TUBING AQUILEX OUTFLOW (TUBING) ×2 IMPLANT
WATER STERILE IRR 500ML POUR (IV SOLUTION) IMPLANT

## 2018-03-29 NOTE — Anesthesia Postprocedure Evaluation (Signed)
Anesthesia Post Note  Patient: Renee Abbott  Procedure(s) Performed: DILATATION & CURETTAGE/HYSTEROSCOPY WITH MYOSURE (N/A Vagina )     Patient location during evaluation: PACU Anesthesia Type: General Level of consciousness: awake and alert Pain management: pain level controlled Vital Signs Assessment: post-procedure vital signs reviewed and stable Respiratory status: spontaneous breathing, nonlabored ventilation, respiratory function stable and patient connected to nasal cannula oxygen Cardiovascular status: blood pressure returned to baseline and stable Postop Assessment: no apparent nausea or vomiting Anesthetic complications: no    Last Vitals:  Vitals:   03/29/18 1200 03/29/18 1228  BP: 127/78 128/77  Pulse: 86 72  Resp: 17 16  Temp:  37 C  SpO2: 100% 100%    Last Pain:  Vitals:   03/29/18 1228  TempSrc:   PainSc: 0-No pain   Pain Goal: Patients Stated Pain Goal: 6 (03/29/18 0920)               Catheryn Bacon Calynn Ferrero

## 2018-03-29 NOTE — Op Note (Signed)
Preoperative Diagnosis: Postmenopausal bleeding   Postoperative Diagnosis: Postmenopausal bleeding, endometrial polyp  Procedure: Hysteroscopy, endometrial polyp, dilation and curettage  Surgeon: Dr Gertie Exon  Assistants: None  Anesthesia: General via LMA  EBL: minimal  Fluids: 650 cc LR  Fluid deficit: 140 cc  Urine output: not recorded  Indications for surgery: The patient is a 57 yo female, who presented with postmenopausal bleeding on HRT. Work up included an endometrial biopsy with proliferative endometrium and a sonohysterogram with an intracavitary defect (likely polyp).  The risks of the surgery were reviewed with the patient and the consent form was signed prior to her surgery.  Findings: Normal sized uterus, no adnexal masses  Specimens: Endometrial polyp, endometrial polyp   Procedure: The patient was taken to the operating room with an IV in place. She was placed in the dorsal lithotomy position and anesthesia was administered. She was prepped and draped in the usual sterile fashion for a vaginal procedure. She voided on the way to the OR. A weighted speculum was placed in the vagina and a single tooth tenaculum was placed on the anterior lip of the cervix. The cervix was dilated to a #7 hagar dilator. The uterus was sounded to 7 cm. The myosure hysteroscope was inserted into the uterine cavity. With continuous infusion of normal saline, the uterine cavity was visualized with the above findings. The myosure reach was used to resect the polyp. The myosure was then removed. The cavity was then curetted with the small sharp curette. The cavity had the characteristically gritty texture at the end of the procedure. The curette and the single tooth tenaculum were removed. The speculum was removed. The patients perineum was cleansed of betadine and she was taken out of the dorsal lithotomy position.  Upon awakening the LMA was removed and the patient was transferred to the  recovery room in stable and awake condition.  The sponge and instrument count were correct. There were no complications.

## 2018-03-29 NOTE — Discharge Instructions (Signed)
  Post Anesthesia Home Care Instructions  Activity: Get plenty of rest for the remainder of the day. A responsible individual must stay with you for 24 hours following the procedure.  For the next 24 hours, DO NOT: -Drive a car -Operate machinery -Drink alcoholic beverages -Take any medication unless instructed by your physician -Make any legal decisions or sign important papers.  Meals: Start with liquid foods such as gelatin or soup. Progress to regular foods as tolerated. Avoid greasy, spicy, heavy foods. If nausea and/or vomiting occur, drink only clear liquids until the nausea and/or vomiting subsides. Call your physician if vomiting continues.  Special Instructions/Symptoms: Your throat may feel dry or sore from the anesthesia or the breathing tube placed in your throat during surgery. If this causes discomfort, gargle with warm salt water. The discomfort should disappear within 24 hours.  If you had a scopolamine patch placed behind your ear for the management of post- operative nausea and/or vomiting:  1. The medication in the patch is effective for 72 hours, after which it should be removed.  Wrap patch in a tissue and discard in the trash. Wash hands thoroughly with soap and water. 2. You may remove the patch earlier than 72 hours if you experience unpleasant side effects which may include dry mouth, dizziness or visual disturbances. 3. Avoid touching the patch. Wash your hands with soap and water after contact with the patch.        DISCHARGE INSTRUCTIONS: D&C / D&E The following instructions have been prepared to help you care for yourself upon your return home.   Personal hygiene:  Use sanitary pads for vaginal drainage, not tampons.  Shower the day after your procedure.  NO tub baths, pools or Jacuzzis for 2-3 weeks.  Wipe front to back after using the bathroom.  Activity and limitations:  Do NOT drive or operate any equipment for 24 hours. The effects of anesthesia  are still present and drowsiness may result.  Do NOT rest in bed all day.  Walking is encouraged.  Walk up and down stairs slowly.  You may resume your normal activity in one to two days or as indicated by your physician.  Sexual activity: NO intercourse for at least 2 weeks after the procedure, or as indicated by your physician.  Diet: Eat a light meal as desired this evening. You may resume your usual diet tomorrow.  Return to work: You may resume your work activities in one to two days or as indicated by your doctor.  What to expect after your surgery: Expect to have vaginal bleeding/discharge for 2-3 days and spotting for up to 10 days. It is not unusual to have soreness for up to 1-2 weeks. You may have a slight burning sensation when you urinate for the first day. Mild cramps may continue for a couple of days. You may have a regular period in 2-6 weeks.  Call your doctor for any of the following:  Excessive vaginal bleeding, saturating and changing one pad every hour.  Inability to urinate 6 hours after discharge from hospital.  Pain not relieved by pain medication.  Fever of 100.4 F or greater.  Unusual vaginal discharge or odor.       

## 2018-03-29 NOTE — Anesthesia Procedure Notes (Signed)
Procedure Name: LMA Insertion Date/Time: 03/29/2018 10:56 AM Performed by: Tyrone Nine, CRNA Pre-anesthesia Checklist: Patient identified, Timeout performed, Emergency Drugs available, Suction available and Patient being monitored Patient Re-evaluated:Patient Re-evaluated prior to induction Oxygen Delivery Method: Circle system utilized Preoxygenation: Pre-oxygenation with 100% oxygen Induction Type: IV induction Ventilation: Mask ventilation without difficulty LMA: LMA inserted LMA Size: 4.0 Number of attempts: 1 Placement Confirmation: breath sounds checked- equal and bilateral,  CO2 detector and positive ETCO2 Tube secured with: Tape Dental Injury: Teeth and Oropharynx as per pre-operative assessment

## 2018-03-29 NOTE — Transfer of Care (Signed)
Immediate Anesthesia Transfer of Care Note  Patient: Renee Abbott  Procedure(s) Performed: DILATATION & CURETTAGE/HYSTEROSCOPY WITH MYOSURE (N/A Vagina )  Patient Location: PACU  Anesthesia Type:General  Level of Consciousness: awake, alert , oriented and patient cooperative  Airway & Oxygen Therapy: Patient Spontanous Breathing and Patient connected to nasal cannula oxygen  Post-op Assessment: Report given to RN and Post -op Vital signs reviewed and stable  Post vital signs: Reviewed and stable  Last Vitals:  Vitals Value Taken Time  BP    Temp    Pulse 87 03/29/2018 11:28 AM  Resp 14 03/29/2018 11:28 AM  SpO2 100 % 03/29/2018 11:28 AM  Vitals shown include unvalidated device data.  Last Pain:  Vitals:   03/29/18 0920  TempSrc:   PainSc: 0-No pain      Patients Stated Pain Goal: 6 (03/29/18 0920)  Complications: No apparent anesthesia complications

## 2018-03-29 NOTE — Interval H&P Note (Signed)
History and Physical Interval Note:  03/29/2018 10:22 AM  Renee Abbott  has presented today for surgery, with the diagnosis of PMB, endometrial polyp  The various methods of treatment have been discussed with the patient and family. After consideration of risks, benefits and other options for treatment, the patient has consented to  Procedure(s): DILATATION & CURETTAGE/HYSTEROSCOPY WITH MYOSURE (N/A) as a surgical intervention .  The patient's history has been reviewed, patient examined, no change in status, stable for surgery.  I have reviewed the patient's chart and labs.  Questions were answered to the patient's satisfaction.     Romualdo Bolk

## 2018-03-29 NOTE — Anesthesia Preprocedure Evaluation (Addendum)
Anesthesia Evaluation  Patient identified by MRN, date of birth, ID band Patient awake    Reviewed: Allergy & Precautions, NPO status , Patient's Chart, lab work & pertinent test results  Airway Mallampati: II  TM Distance: >3 FB Neck ROM: Full    Dental no notable dental hx. (+) Teeth Intact, Dental Advisory Given   Pulmonary former smoker,    Pulmonary exam normal breath sounds clear to auscultation       Cardiovascular Exercise Tolerance: Good negative cardio ROS Normal cardiovascular exam Rhythm:Regular Rate:Normal     Neuro/Psych negative neurological ROS  negative psych ROS   GI/Hepatic negative GI ROS, Neg liver ROS,   Endo/Other  negative endocrine ROS  Renal/GU negative Renal ROS     Musculoskeletal negative musculoskeletal ROS (+)   Abdominal   Peds  Hematology negative hematology ROS (+)   Anesthesia Other Findings PMB Endometrial polyp  Reproductive/Obstetrics                            Anesthesia Physical Anesthesia Plan  ASA: II  Anesthesia Plan: General   Post-op Pain Management:    Induction: Intravenous  PONV Risk Score and Plan: 2 and Ondansetron, Dexamethasone, Midazolam and Treatment may vary due to age or medical condition  Airway Management Planned: Mask  Additional Equipment:   Intra-op Plan:   Post-operative Plan:   Informed Consent: I have reviewed the patients History and Physical, chart, labs and discussed the procedure including the risks, benefits and alternatives for the proposed anesthesia with the patient or authorized representative who has indicated his/her understanding and acceptance.   Dental advisory given  Plan Discussed with: CRNA  Anesthesia Plan Comments:         Anesthesia Quick Evaluation

## 2018-03-30 ENCOUNTER — Encounter (HOSPITAL_BASED_OUTPATIENT_CLINIC_OR_DEPARTMENT_OTHER): Payer: Self-pay | Admitting: Obstetrics and Gynecology

## 2018-04-04 ENCOUNTER — Telehealth: Payer: Self-pay | Admitting: Obstetrics and Gynecology

## 2018-04-04 NOTE — Telephone Encounter (Signed)
Phone call from Dr Kenard Gower in Pathology. He reviewed her endometrial curettage and states the tissue is atrophic.

## 2018-04-05 ENCOUNTER — Telehealth: Payer: Self-pay | Admitting: Obstetrics and Gynecology

## 2018-04-05 NOTE — Telephone Encounter (Signed)
As long as she doesn't have heavy bleeding, pain or fever, I think she is fine. It shouldn't persist.

## 2018-04-05 NOTE — Telephone Encounter (Signed)
Spoke with patient. Patient had a D&C on 03/29/2018. States yesterdays he lifted her grandson who is 20-25 pounds and a few hours later had a quarter sized spot of blood. Woke up this morning to a very small spot of blood. Denies any pain or heavy bleeding. Patient is concerned as she was not having any bleeding before doing this. Advised she needs to take it easy and not perform any heavy lifting. Advised will review with Dr.Jertson and return call.

## 2018-04-05 NOTE — Telephone Encounter (Signed)
Patient started bleeding yesterday afternoon after having a D&C last week.

## 2018-04-05 NOTE — Telephone Encounter (Signed)
Spoke with patient. Advised of message as seen below from Dr.Jertson. Patient verbalizes understanding. Encounter closed. 

## 2018-04-12 ENCOUNTER — Encounter: Payer: Self-pay | Admitting: Obstetrics and Gynecology

## 2018-04-12 ENCOUNTER — Other Ambulatory Visit: Payer: Self-pay

## 2018-04-12 ENCOUNTER — Ambulatory Visit (INDEPENDENT_AMBULATORY_CARE_PROVIDER_SITE_OTHER): Payer: BLUE CROSS/BLUE SHIELD | Admitting: Obstetrics and Gynecology

## 2018-04-12 VITALS — BP 140/78 | HR 72 | Resp 14 | Wt 148.0 lb

## 2018-04-12 DIAGNOSIS — N950 Postmenopausal bleeding: Secondary | ICD-10-CM

## 2018-04-12 DIAGNOSIS — Z9889 Other specified postprocedural states: Secondary | ICD-10-CM | POA: Diagnosis not present

## 2018-04-12 NOTE — Progress Notes (Signed)
GYNECOLOGY  VISIT   H21PI: 56 y.o.   Married  Caucasian  female   G1P1001 with Patient's last menstrual period was 08/30/2014 (exact date).   here for follow up. Patient is 2 weeks s/p D&C hysteroscopy, polypectomy.  Pathology was benign and the endometrium was atrophic. No problems since surgery, slight spotting, no pain.  Of note the patent had a 1.9 cm left ovarian cyst with PMP FSH and normal CA 125. She will return for a 3 month f/u ultrasound.      GYNECOLOGIC HISTORY: Patient's last menstrual period was 08/30/2014 (exact date). Contraception:postmenopause Menopausal hormone therapy: Prempro         OB History    Gravida  1   Para  1   Term  1   Preterm      AB      Living  1     SAB      TAB      Ectopic      Multiple      Live Births  1              Patient Active Problem List   Diagnosis Date Noted  . LBP (low back pain) 10/05/2017  . Arthralgia of hip 10/05/2017    Past Medical History:  Diagnosis Date  . Multiple thyroid nodules    followed by pcp-- per pt ultrasound done 02-24-2018 in epic  . PMB (postmenopausal bleeding)   . Wears contact lenses     Past Surgical History:  Procedure Laterality Date  . CARPAL TUNNEL RELEASE Right 2010  . DILATATION & CURETTAGE/HYSTEROSCOPY WITH MYOSURE N/A 03/29/2018   Procedure: DILATATION & CURETTAGE/HYSTEROSCOPY WITH MYOSURE;  Surgeon: Romualdo Bolk, MD;  Location: [facility];  Service: Gynecology;  Laterality: N/A;    Current Outpatient Medications  Medication Sig Dispense Refill  . Cholecalciferol (VITAMIN D PO) Take 1,000 Units by mouth every morning.     . estrogen, conjugated,-medroxyprogesterone (PREMPRO) 0.3-1.5 MG tablet Take 1 tablet daily by mouth. (Patient taking differently: Take 1 tablet by mouth every morning. ) 30 tablet 12  . naproxen sodium (ANAPROX) 220 MG tablet Take 220 mg by mouth 2 (two) times daily with a meal.     No current facility-administered  medications for this visit.      ALLERGIES: Patient has no known allergies.  Family History  Problem Relation Age of Onset  . Hypertension Mother   . Thyroid disease Mother   . Diabetes Father   . Cancer Father 86       Dec metastatic Lung CA 2015prostate, Lung dx 06/26/14  . Hypertension Father   . Heart disease Father     Social History   Socioeconomic History  . Marital status: Married    Spouse name: Not on file  . Number of children: Not on file  . Years of education: Not on file  . Highest education level: Not on file  Occupational History  . Not on file  Social Needs  . Financial resource strain: Not on file  . Food insecurity:    Worry: Not on file    Inability: Not on file  . Transportation needs:    Medical: Not on file    Non-medical: Not on file  Tobacco Use  . Smoking status: Former Smoker    Years: 6.00    Types: Cigarettes    Last attempt to quit: 03/24/1991    Years since quitting: 27.0  . Smokeless tobacco: Never  Used  Substance and Sexual Activity  . Alcohol use: No    Alcohol/week: 0.0 oz    Comment: occasional  . Drug use: No  . Sexual activity: Yes    Partners: Male    Comment: husband vasectomy  Lifestyle  . Physical activity:    Days per week: Not on file    Minutes per session: Not on file  . Stress: Not on file  Relationships  . Social connections:    Talks on phone: Not on file    Gets together: Not on file    Attends religious service: Not on file    Active member of club or organization: Not on file    Attends meetings of clubs or organizations: Not on file    Relationship status: Not on file  . Intimate partner violence:    Fear of current or ex partner: Not on file    Emotionally abused: Not on file    Physically abused: Not on file    Forced sexual activity: Not on file  Other Topics Concern  . Not on file  Social History Narrative  . Not on file    Review of Systems  Constitutional: Negative.   HENT: Negative.    Eyes: Negative.   Respiratory: Negative.   Cardiovascular: Negative.   Gastrointestinal: Negative.   Genitourinary: Negative.   Musculoskeletal: Negative.   Skin: Negative.   Neurological: Negative.   Endo/Heme/Allergies: Negative.   Psychiatric/Behavioral: Negative.     PHYSICAL EXAMINATION:    BP 140/78 (BP Location: Right Arm, Patient Position: Sitting, Cuff Size: Normal)   Pulse 72   Resp 14   Wt 148 lb (67.1 kg)   LMP 08/30/2014 (Exact Date)   BMI 27.07 kg/m     General appearance: alert, cooperative and appears stated age Abdomen: soft, non-tender; non distended, no masses,  no organomegaly   Surgery photo's reviewed with the patient  ASSESSMENT PMP bleeding, s/p hysteroscopy, polypectomy, D&C. Pathology was benign with atrophic endometrium The patient had a small ovarian cyst at her last u/s with a PMP FSH and normal CA 125    PLAN Call with any concerns F/U for repeat u/s in 7/19   An After Visit Summary was printed and given to the patient.

## 2018-06-07 ENCOUNTER — Ambulatory Visit: Payer: BLUE CROSS/BLUE SHIELD | Admitting: Obstetrics and Gynecology

## 2018-06-07 ENCOUNTER — Other Ambulatory Visit: Payer: Self-pay

## 2018-06-07 ENCOUNTER — Ambulatory Visit (INDEPENDENT_AMBULATORY_CARE_PROVIDER_SITE_OTHER): Payer: BLUE CROSS/BLUE SHIELD

## 2018-06-07 ENCOUNTER — Encounter: Payer: Self-pay | Admitting: Obstetrics and Gynecology

## 2018-06-07 VITALS — BP 124/80 | HR 64 | Wt 148.0 lb

## 2018-06-07 DIAGNOSIS — N83202 Unspecified ovarian cyst, left side: Secondary | ICD-10-CM

## 2018-06-07 DIAGNOSIS — N83209 Unspecified ovarian cyst, unspecified side: Secondary | ICD-10-CM

## 2018-06-07 NOTE — Progress Notes (Signed)
GYNECOLOGY  VISIT   HPI: 57 y.o.   Married  Caucasian  female   G1P1001 with Patient's last menstrual period was 08/30/2014 (exact date).   here for PUS consult.   The patient is PMP and was incidentally noted to have a 1.9 cm simple left ovarian cyst on an ultrasound done for PMP bleeding. She had a normal CA 125 and a PMP FSH. She underwent hysteroscopic polypectomy, D&C with benign and atrophic pathology. No further bleeding, no pain. No c/o.  GYNECOLOGIC HISTORY: Patient's last menstrual period was 08/30/2014 (exact date). Contraception:postmenopausal Menopausal hormone therapy: Prempro        OB History    Gravida  1   Para  1   Term  1   Preterm      AB      Living  1     SAB      TAB      Ectopic      Multiple      Live Births  1              Patient Active Problem List   Diagnosis Date Noted  . LBP (low back pain) 10/05/2017  . Arthralgia of hip 10/05/2017    Past Medical History:  Diagnosis Date  . Multiple thyroid nodules    followed by pcp-- per pt ultrasound done 02-24-2018 in epic  . PMB (postmenopausal bleeding)   . Wears contact lenses     Past Surgical History:  Procedure Laterality Date  . CARPAL TUNNEL RELEASE Right 2010  . DILATATION & CURETTAGE/HYSTEROSCOPY WITH MYOSURE N/A 03/29/2018   Procedure: DILATATION & CURETTAGE/HYSTEROSCOPY WITH MYOSURE;  Surgeon: Romualdo BolkJertson, Maizey Menendez Evelyn, MD;  Location: [facility];  Service: Gynecology;  Laterality: N/A;  . HYSTEROSCOPY      Current Outpatient Medications  Medication Sig Dispense Refill  . Cholecalciferol (VITAMIN D PO) Take 1,000 Units by mouth every morning.     . estrogen, conjugated,-medroxyprogesterone (PREMPRO) 0.3-1.5 MG tablet Take 1 tablet daily by mouth. (Patient taking differently: Take 1 tablet by mouth every morning. ) 30 tablet 12  . naproxen sodium (ANAPROX) 220 MG tablet Take 220 mg by mouth 2 (two) times daily with a meal.     No current  facility-administered medications for this visit.      ALLERGIES: Patient has no known allergies.  Family History  Problem Relation Age of Onset  . Hypertension Mother   . Thyroid disease Mother   . Diabetes Father   . Cancer Father [phone]       Dec metastatic Lung CA 2015prostate, Lung dx 06/26/14  . Hypertension Father   . Heart disease Father     Social History   Socioeconomic History  . Marital status: Married    Spouse name: Not on file  . Number of children: Not on file  . Years of education: Not on file  . Highest education level: Not on file  Occupational History  . Not on file  Social Needs  . Financial resource strain: Not on file  . Food insecurity:    Worry: Not on file    Inability: Not on file  . Transportation needs:    Medical: Not on file    Non-medical: Not on file  Tobacco Use  . Smoking status: Former Smoker    Years: 6.00    Types: Cigarettes    Last attempt to quit: 03/24/1991    Years since quitting: 27.2  . Smokeless tobacco: Never  Used  Substance and Sexual Activity  . Alcohol use: No    Alcohol/week: 0.0 oz    Comment: occasional  . Drug use: No  . Sexual activity: Yes    Partners: Male    Comment: husband vasectomy  Lifestyle  . Physical activity:    Days per week: Not on file    Minutes per session: Not on file  . Stress: Not on file  Relationships  . Social connections:    Talks on phone: Not on file    Gets together: Not on file    Attends religious service: Not on file    Active member of club or organization: Not on file    Attends meetings of clubs or organizations: Not on file    Relationship status: Not on file  . Intimate partner violence:    Fear of current or ex partner: Not on file    Emotionally abused: Not on file    Physically abused: Not on file    Forced sexual activity: Not on file  Other Topics Concern  . Not on file  Social History Narrative  . Not on file    Review of Systems  Constitutional: Negative.    HENT: Negative.   Eyes: Negative.   Respiratory: Negative.   Cardiovascular: Negative.   Gastrointestinal: Negative.   Genitourinary: Negative.   Musculoskeletal: Negative.   Skin: Negative.   Neurological: Negative.   Endo/Heme/Allergies: Negative.   Psychiatric/Behavioral: Negative.     PHYSICAL EXAMINATION:    LMP 08/30/2014 (Exact Date)     General appearance: alert, cooperative and appears stated age  Ultrasound images reviewed with the patient  ASSESSMENT Stable 2 cm, simple left ovarian cyst in a PMP female.     PLAN One more f/u ultrasound in 6 months. If unchanged will not further evaluate.    An After Visit Summary was printed and given to the patient.

## 2018-06-09 ENCOUNTER — Telehealth: Payer: Self-pay | Admitting: Obstetrics and Gynecology

## 2018-06-09 NOTE — Telephone Encounter (Signed)
Call placed to patient to review benefits and schedule a recommended ultrasound. Left voicemail message requesting a return call. °

## 2018-10-12 ENCOUNTER — Ambulatory Visit (INDEPENDENT_AMBULATORY_CARE_PROVIDER_SITE_OTHER): Payer: BLUE CROSS/BLUE SHIELD | Admitting: Certified Nurse Midwife

## 2018-10-12 ENCOUNTER — Other Ambulatory Visit: Payer: Self-pay

## 2018-10-12 ENCOUNTER — Encounter: Payer: Self-pay | Admitting: Certified Nurse Midwife

## 2018-10-12 VITALS — BP 120/78 | HR 68 | Resp 16 | Ht 62.0 in | Wt 151.0 lb

## 2018-10-12 DIAGNOSIS — N951 Menopausal and female climacteric states: Secondary | ICD-10-CM

## 2018-10-12 DIAGNOSIS — E042 Nontoxic multinodular goiter: Secondary | ICD-10-CM

## 2018-10-12 DIAGNOSIS — Z79890 Hormone replacement therapy (postmenopausal): Secondary | ICD-10-CM | POA: Diagnosis not present

## 2018-10-12 DIAGNOSIS — Z01419 Encounter for gynecological examination (general) (routine) without abnormal findings: Secondary | ICD-10-CM

## 2018-10-12 NOTE — Progress Notes (Signed)
57 y.o. G67P1001 Married  Caucasian Fe here for annual exam. Denies vaginal bleeding since episode in 03/01/18 and had D&C and polypectomy with Dr. Oscar La. Patient being followed with ovarian cyst and follow up PUS 01/2019. Had pap smear at visit in 03/01/18. ASCUS negative HPV. Patient continues with Prempro use and would like to continue. She discussed with Dr. Oscar La and no issues at this time. Sees Urgent care if needed. Declines screening labs today.  Patient's last menstrual period was 08/30/2014 (exact date).          Sexually active: No.  The current method of family planning is vasectomy, post menopausal.    Exercising: Yes.    walking Smoker:  no  Review of Systems  Constitutional: Negative.   HENT: Negative.   Eyes: Negative.   Respiratory: Negative.   Cardiovascular: Negative.   Gastrointestinal: Negative.   Genitourinary: Negative.   Musculoskeletal: Negative.   Skin: Negative.   Neurological: Negative.   Endo/Heme/Allergies: Negative.   Psychiatric/Behavioral: Negative.     Health Maintenance: Pap:  09-30-16 neg, 02-15-18 ASCUS HPV HR neg History of Abnormal Pap: no MMG:  11-29-17 category c density birads 1:neg Self Breast exams: occ Colonoscopy:  2019 f/u 57yrs BMD:  none TDaP:  2015 Shingles: no Pneumonia: no Hep C and HIV: hep c neg 2017 Labs: if beeded   reports that she quit smoking about 27 years ago. Her smoking use included cigarettes. She quit after 6.00 years of use. She has never used smokeless tobacco. She reports that she does not drink alcohol or use drugs.  Past Medical History:  Diagnosis Date  . Multiple thyroid nodules    followed by pcp-- per pt ultrasound done 02-24-2018 in epic  . PMB (postmenopausal bleeding)   . Wears contact lenses     Past Surgical History:  Procedure Laterality Date  . CARPAL TUNNEL RELEASE Right 2010  . DILATATION & CURETTAGE/HYSTEROSCOPY WITH MYOSURE N/A 03/29/2018   Procedure: DILATATION & CURETTAGE/HYSTEROSCOPY  WITH MYOSURE;  Surgeon: Romualdo Bolk, MD;  Location: [facility];  Service: Gynecology;  Laterality: N/A;  . HYSTEROSCOPY      Current Outpatient Medications  Medication Sig Dispense Refill  . Cholecalciferol (VITAMIN D PO) Take 1,000 Units by mouth every morning.     . estrogen, conjugated,-medroxyprogesterone (PREMPRO) 0.3-1.5 MG tablet Take 1 tablet daily by mouth. (Patient taking differently: Take 1 tablet by mouth every morning. ) 30 tablet 12  . naproxen sodium (ANAPROX) 220 MG tablet Take 220 mg by mouth 2 (two) times daily with a meal.     No current facility-administered medications for this visit.     Family History  Problem Relation Age of Onset  . Hypertension Mother   . Thyroid disease Mother   . Diabetes Father   . Cancer Father 26       Dec metastatic Lung CA 2015prostate, Lung dx 06/26/14  . Hypertension Father   . Heart disease Father     ROS:  Pertinent items are noted in HPI.  Otherwise, a comprehensive ROS was negative.  Exam:   BP 120/78   Pulse 68   Resp 16   Ht 5\' 2"  (1.575 m)   Wt 151 lb (68.5 kg)   LMP 08/30/2014 (Exact Date)   BMI 27.62 kg/m  Height: 5\' 2"  (157.5 cm) Ht Readings from Last 3 Encounters:  10/12/18 5\' 2"  (1.575 m)  03/29/18 5\' 2"  (1.575 m)  10/05/17 5\' 2"  (1.575 m)    General  appearance: alert, cooperative and appears stated age Head: Normocephalic, without obvious abnormality, atraumatic Neck: no adenopathy, supple, symmetrical, trachea midline and thyroid nodular to palpation Lungs: clear to auscultation bilaterally Breasts: normal appearance, no masses or tenderness, No nipple retraction or dimpling, No nipple discharge or bleeding, No axillary or supraclavicular adenopathy Heart: regular rate and rhythm Abdomen: soft, non-tender; no masses,  no organomegaly Extremities: extremities normal, atraumatic, no cyanosis or edema Skin: Skin color, texture, turgor normal. No rashes or lesions Lymph nodes:  Cervical, supraclavicular, and axillary nodes normal. No abnormal inguinal nodes palpated Neurologic: Grossly normal   Pelvic: External genitalia:  no lesions              Urethra:  normal appearing urethra with no masses, tenderness or lesions              Bartholin's and Skene's: normal                 Vagina: normal appearing vagina with normal color and discharge, no lesions              Cervix: multiparous appearance, no cervical motion tenderness and no lesions              Pap taken: No. Bimanual Exam:  Uterus:  anteverted              Adnexa: normal adnexa and no mass, fullness, tenderness               Rectovaginal: Confirms               Anus:  normal sphincter tone, no lesions  Chaperone present: yes  A:  Well Woman with normal exam  Menopausal on HRT desires continuance  History of PMB and no reoccurrence  Ovarian cyst under follow up with Dr. Oscar LaJertson  ASCUS pap with negative HPV  Nodular thyroid under follow up, due for repeat labs in 2020  P:   Reviewed health and wellness pertinent to exam  Discussed risks/benefits/warning signs with use.  Rx Prempro see order with instructions.  Continue follow up as indicated with Dr. Oscar LaJertson.  Discussed having TSH with panel at follow up exam, will be current when US of thyroid done. Order placed.  Pap smear: no   counseled on breast self exam, mammography screening, STD prevention, HIV risk factors and prevention, use and side effects of HRT, adequate intake of calcium and vitamin D, diet and exercise  return annually or prn  An After Visit Summary was printed and given to the patient.

## 2018-10-12 NOTE — Patient Instructions (Signed)

## 2018-10-26 ENCOUNTER — Other Ambulatory Visit: Payer: Self-pay | Admitting: Certified Nurse Midwife

## 2018-10-26 DIAGNOSIS — N951 Menopausal and female climacteric states: Secondary | ICD-10-CM

## 2018-10-26 NOTE — Telephone Encounter (Signed)
Medication refill request: prempro  Last AEX:  10-12-18 DL  Next AEX: [id number] Last MMG (if hormonal medication request): 11-29-17 density C/BIRADS 1 negative  Refill authorized: 10-05-17 #30, 12 RF. Today, please advise.

## 2018-10-31 ENCOUNTER — Other Ambulatory Visit: Payer: Self-pay

## 2018-12-05 DIAGNOSIS — Z1231 Encounter for screening mammogram for malignant neoplasm of breast: Secondary | ICD-10-CM | POA: Diagnosis not present

## 2018-12-19 NOTE — Progress Notes (Signed)
GYNECOLOGY  VISIT   HPI: 58 y.o.   Married White or Caucasian Not Hispanic or Latino  female   G1P1001 with Patient's last menstrual period was 08/30/2014 (exact date).   here for consult following PUS.  She was incidentally noted to have an ~2 cm simple left ovarian cyst on an ultrasound in 4/19 which was done for PMP bleeding. She had a normal CA 125 and a PMP FSH. No c/o today.   GYNECOLOGIC HISTORY: Patient's last menstrual period was 08/30/2014 (exact date). Contraception: Postmenopausal, spouse with vasectomy Menopausal hormone therapy: None        OB History    Gravida  1   Para  1   Term  1   Preterm      AB      Living  1     SAB      TAB      Ectopic      Multiple      Live Births  1              Patient Active Problem List   Diagnosis Date Noted  . LBP (low back pain) 10/05/2017  . Arthralgia of hip 10/05/2017    Past Medical History:  Diagnosis Date  . Multiple thyroid nodules    followed by pcp-- per pt ultrasound done 02-24-2018 in epic  . PMB (postmenopausal bleeding)   . Wears contact lenses     Past Surgical History:  Procedure Laterality Date  . CARPAL TUNNEL RELEASE Right 2010  . DILATATION & CURETTAGE/HYSTEROSCOPY WITH MYOSURE N/A 03/29/2018   Procedure: DILATATION & CURETTAGE/HYSTEROSCOPY WITH MYOSURE;  Surgeon: Romualdo Bolk, MD;  Location: [facility];  Service: Gynecology;  Laterality: N/A;  . HYSTEROSCOPY      Current Outpatient Medications  Medication Sig Dispense Refill  . Cholecalciferol (VITAMIN D PO) Take 1,000 Units by mouth every morning.     . naproxen sodium (ANAPROX) 220 MG tablet Take 220 mg by mouth 2 (two) times daily with a meal.    . PREMPRO 0.3-1.5 MG tablet TAKE 1 TABLET BY MOUTH DAILY (Patient not taking: Reported on 12/20/2018) 28 tablet 1   No current facility-administered medications for this visit.      ALLERGIES: Patient has no known allergies.  Family History  Problem  Relation Age of Onset  . Hypertension Mother   . Thyroid disease Mother   . Diabetes Father   . Cancer Father 108       Dec metastatic Lung CA 2015prostate, Lung dx 06/26/14  . Hypertension Father   . Heart disease Father     Social History   Socioeconomic History  . Marital status: Married    Spouse name: Not on file  . Number of children: Not on file  . Years of education: Not on file  . Highest education level: Not on file  Occupational History  . Not on file  Social Needs  . Financial resource strain: Not on file  . Food insecurity:    Worry: Not on file    Inability: Not on file  . Transportation needs:    Medical: Not on file    Non-medical: Not on file  Tobacco Use  . Smoking status: Former Smoker    Years: 6.00    Types: Cigarettes    Last attempt to quit: 03/24/1991    Years since quitting: 27.7  . Smokeless tobacco: Never Used  Substance and Sexual Activity  . Alcohol use: No  Alcohol/week: 0.0 standard drinks  . Drug use: Never  . Sexual activity: Not Currently    Partners: Male    Birth control/protection: Post-menopausal    Comment: husband vasectomy  Lifestyle  . Physical activity:    Days per week: Not on file    Minutes per session: Not on file  . Stress: Not on file  Relationships  . Social connections:    Talks on phone: Not on file    Gets together: Not on file    Attends religious service: Not on file    Active member of club or organization: Not on file    Attends meetings of clubs or organizations: Not on file    Relationship status: Not on file  . Intimate partner violence:    Fear of current or ex partner: Not on file    Emotionally abused: Not on file    Physically abused: Not on file    Forced sexual activity: Not on file  Other Topics Concern  . Not on file  Social History Narrative  . Not on file    Review of Systems  Constitutional: Negative.   HENT: Negative.   Eyes: Negative.   Respiratory: Negative.    Cardiovascular: Negative.   Gastrointestinal: Negative.   Genitourinary: Negative.   Musculoskeletal: Negative.   Skin: Negative.   Neurological: Negative.   Endo/Heme/Allergies: Negative.   Psychiatric/Behavioral: Negative.     PHYSICAL EXAMINATION:    BP 122/72 (BP Location: Right Arm, Patient Position: Sitting, Cuff Size: Normal)   Pulse 68   Wt 150 lb (68 kg)   LMP 08/30/2014 (Exact Date)   BMI 27.44 kg/m     General appearance: alert, cooperative and appears stated age  Reviewed ultrasound images with the patient  ASSESSMENT 2 cm simple left ovarian cyst, in PMP female. Normal CA125    PLAN After discussion with the patient, will do one more u/s in 6 months (will be then over one year since initially diagnosed). If unchanged will not further evaluate.    An After Visit Summary was printed and given to the patient.

## 2018-12-20 ENCOUNTER — Other Ambulatory Visit: Payer: Self-pay | Admitting: Obstetrics and Gynecology

## 2018-12-20 ENCOUNTER — Other Ambulatory Visit: Payer: Self-pay

## 2018-12-20 ENCOUNTER — Ambulatory Visit: Payer: BLUE CROSS/BLUE SHIELD | Admitting: Obstetrics and Gynecology

## 2018-12-20 ENCOUNTER — Encounter: Payer: Self-pay | Admitting: Obstetrics and Gynecology

## 2018-12-20 ENCOUNTER — Ambulatory Visit (INDEPENDENT_AMBULATORY_CARE_PROVIDER_SITE_OTHER): Payer: BLUE CROSS/BLUE SHIELD

## 2018-12-20 VITALS — BP 122/72 | HR 68 | Wt 150.0 lb

## 2018-12-20 DIAGNOSIS — N83202 Unspecified ovarian cyst, left side: Secondary | ICD-10-CM

## 2018-12-20 DIAGNOSIS — Z780 Asymptomatic menopausal state: Secondary | ICD-10-CM

## 2018-12-28 DIAGNOSIS — G5602 Carpal tunnel syndrome, left upper limb: Secondary | ICD-10-CM | POA: Diagnosis not present

## 2019-01-17 DIAGNOSIS — R202 Paresthesia of skin: Secondary | ICD-10-CM | POA: Diagnosis not present

## 2019-01-17 DIAGNOSIS — R200 Anesthesia of skin: Secondary | ICD-10-CM | POA: Diagnosis not present

## 2019-01-26 DIAGNOSIS — G5602 Carpal tunnel syndrome, left upper limb: Secondary | ICD-10-CM | POA: Diagnosis not present

## 2019-01-29 DIAGNOSIS — G5602 Carpal tunnel syndrome, left upper limb: Secondary | ICD-10-CM | POA: Insufficient documentation

## 2019-01-31 ENCOUNTER — Ambulatory Visit
Admission: RE | Admit: 2019-01-31 | Discharge: 2019-01-31 | Disposition: A | Discharge status: Home / Self Care | Payer: BLUE CROSS/BLUE SHIELD | Source: Ambulatory Visit | Attending: Obstetrics and Gynecology | Admitting: Obstetrics and Gynecology

## 2019-01-31 DIAGNOSIS — E042 Nontoxic multinodular goiter: Secondary | ICD-10-CM

## 2019-01-31 DIAGNOSIS — E041 Nontoxic single thyroid nodule: Secondary | ICD-10-CM | POA: Diagnosis not present

## 2019-02-01 ENCOUNTER — Other Ambulatory Visit: Payer: Self-pay | Admitting: Emergency Medicine

## 2019-02-01 DIAGNOSIS — E042 Nontoxic multinodular goiter: Secondary | ICD-10-CM

## 2019-02-06 ENCOUNTER — Encounter
Admission: RE | Admit: 2019-02-06 | Discharge: 2019-02-06 | Disposition: A | Discharge status: Home / Self Care | Payer: BLUE CROSS/BLUE SHIELD | Source: Ambulatory Visit | Attending: Orthopedic Surgery | Admitting: Orthopedic Surgery

## 2019-02-06 ENCOUNTER — Other Ambulatory Visit: Payer: Self-pay

## 2019-02-06 NOTE — Patient Instructions (Signed)
Your procedure is scheduled on: 02-08-19  Report to Same Day Surgery 2nd floor medical mall [facility] Entrance-take elevator on left to 2nd floor.  Check in with surgery information desk.) To find out your arrival time please call [phone] between 1PM - 3PM on 02-07-19  Remember: Instructions that are not followed completely may result in serious medical risk, up to and including death, or upon the discretion of your surgeon and anesthesiologist your surgery may need to be rescheduled.    _x___ 1. Do not eat food after midnight the night before your procedure. You may drink clear liquids up to 2 hours before you are scheduled to arrive at the hospital for your procedure.  Do not drink clear liquids within 2 hours of your scheduled arrival to the hospital.  Clear liquids include  --Water or Apple juice without pulp  --Clear carbohydrate beverage such as ClearFast or Gatorade  --Black Coffee or Clear Tea (No milk, no creamers, do not add anything to the coffee or Tea   ____Ensure clear carbohydrate drink on the way to the hospital for bariatric patients  ____Ensure clear carbohydrate drink 3 hours before surgery for Dr Rutherford Nail patients if physician instructed.   No gum chewing or hard candies.     __x__ 2. No Alcohol for 24 hours before or after surgery.   __x__3. No Smoking or e-cigarettes for 24 prior to surgery.  Do not use any chewable tobacco products for at least 6 hour prior to surgery   ____  4. Bring all medications with you on the day of surgery if instructed.    __x__ 5. Notify your doctor if there is any change in your medical condition     (cold, fever, infections).    x___6. On the morning of surgery brush your teeth with toothpaste and water.  You may rinse your mouth with mouth wash if you wish.  Do not swallow any toothpaste or mouthwash.   Do not wear jewelry, make-up, hairpins, clips or nail polish.  Do not wear lotions, powders, or perfumes. You may wear  deodorant.  Do not shave 48 hours prior to surgery. Men may shave face and neck.  Do not bring valuables to the hospital.    [facility] is not responsible for any belongings or valuables.               Contacts, dentures or bridgework may not be worn into surgery.  Leave your suitcase in the car. After surgery it may be brought to your room.  For patients admitted to the hospital, discharge time is determined by your treatment team.  _  Patients discharged the day of surgery will not be allowed to drive home.  You will need someone to drive you home and stay with you the night of your procedure.    Please read over the following fact sheets that you were given:   [facility] Preparing for Surgery   ____ Take anti-hypertensive listed below, cardiac, seizure, asthma, anti-reflux and psychiatric medicines. These include:  1. NONE  2.  3.  4.  5.  6.  ____Fleets enema or Magnesium Citrate as directed.   ____ Use CHG Soap or sage wipes as directed on instruction sheet   ____ Use inhalers on the day of surgery and bring to hospital day of surgery  ____ Stop Metformin and Janumet 2 days prior to surgery.    ____ Take 1/2 of usual insulin dose the night before surgery and none  on the morning surgery.   ____ Follow recommendations from Cardiologist, Pulmonologist or PCP regarding stopping Aspirin, Coumadin, Plavix ,Eliquis, Effient, or Pradaxa, and Pletal.  X____Stop Anti-inflammatories such as Advil, Aleve, Ibuprofen, Motrin, Naproxen, Naprosyn, Goodies powders or aspirin products NOW-OK to take Tylenol    _x___ Stop supplements until after surgery-STOP ELDERBERRY NOW-MAY RESUME AFTER SURGERY   ____ Bring C-Pap to the hospital.

## 2019-02-08 ENCOUNTER — Encounter: Payer: Self-pay | Admitting: Orthopedic Surgery

## 2019-02-08 ENCOUNTER — Encounter
Admission: RE | Disposition: A | Discharge status: Home / Self Care | Payer: Self-pay | Source: Home / Self Care | Attending: Orthopedic Surgery

## 2019-02-08 ENCOUNTER — Ambulatory Visit: Payer: BLUE CROSS/BLUE SHIELD | Admitting: Anesthesiology

## 2019-02-08 ENCOUNTER — Other Ambulatory Visit: Payer: Self-pay

## 2019-02-08 ENCOUNTER — Ambulatory Visit
Admission: RE | Admit: 2019-02-08 | Discharge: 2019-02-08 | Disposition: A | Discharge status: Home / Self Care | Payer: BLUE CROSS/BLUE SHIELD | Attending: Orthopedic Surgery | Admitting: Orthopedic Surgery

## 2019-02-08 DIAGNOSIS — Z87891 Personal history of nicotine dependence: Secondary | ICD-10-CM | POA: Diagnosis not present

## 2019-02-08 DIAGNOSIS — Z9889 Other specified postprocedural states: Secondary | ICD-10-CM

## 2019-02-08 DIAGNOSIS — G5602 Carpal tunnel syndrome, left upper limb: Secondary | ICD-10-CM | POA: Diagnosis not present

## 2019-02-08 DIAGNOSIS — Z791 Long term (current) use of non-steroidal anti-inflammatories (NSAID): Secondary | ICD-10-CM | POA: Insufficient documentation

## 2019-02-08 HISTORY — PX: CARPAL TUNNEL RELEASE: SHX101

## 2019-02-08 SURGERY — CARPAL TUNNEL RELEASE
Anesthesia: General | Site: Wrist | Laterality: Left

## 2019-02-08 MED ORDER — BUPIVACAINE HCL (PF) 0.25 % IJ SOLN
INTRAMUSCULAR | Status: AC
  Filled 2019-02-08: qty 30

## 2019-02-08 MED ORDER — LIDOCAINE HCL (PF) 0.5 % IJ SOLN
INTRAMUSCULAR | Status: AC
  Filled 2019-02-08: qty 50

## 2019-02-08 MED ORDER — PROPOFOL 500 MG/50ML IV EMUL
INTRAVENOUS | Status: DC | PRN
  Administered 2019-02-08: 75 ug/kg/min via INTRAVENOUS

## 2019-02-08 MED ORDER — ACETAMINOPHEN 500 MG PO TABS: 1000.0000 mg | ORAL_TABLET | Freq: Once | ORAL | Status: DC | PRN

## 2019-02-08 MED ORDER — BUPIVACAINE HCL (PF) 0.25 % IJ SOLN
INTRAMUSCULAR | Status: DC | PRN
  Administered 2019-02-08: 10 mL

## 2019-02-08 MED ORDER — PROPOFOL 10 MG/ML IV BOLUS
INTRAVENOUS | Status: AC
  Filled 2019-02-08: qty 20

## 2019-02-08 MED ORDER — SODIUM CHLORIDE 0.9 % IV SOLN
INTRAVENOUS | Status: DC | PRN
  Administered 2019-02-08: 500 mL

## 2019-02-08 MED ORDER — IBUPROFEN 600 MG PO TABS: 600.0000 mg | ORAL_TABLET | Freq: Once | ORAL | Status: DC | PRN

## 2019-02-08 MED ORDER — FENTANYL CITRATE (PF) 100 MCG/2ML IJ SOLN
INTRAMUSCULAR | Status: AC
  Filled 2019-02-08: qty 2

## 2019-02-08 MED ORDER — CHLORHEXIDINE GLUCONATE 4 % EX LIQD: 60.0000 mL | Freq: Once | CUTANEOUS | Status: DC

## 2019-02-08 MED ORDER — PROPOFOL 10 MG/ML IV BOLUS
INTRAVENOUS | Status: DC | PRN
  Administered 2019-02-08: 50 mg via INTRAVENOUS

## 2019-02-08 MED ORDER — FAMOTIDINE 20 MG PO TABS
ORAL_TABLET | ORAL | Status: AC
  Administered 2019-02-08: 20 mg via ORAL
  Filled 2019-02-08: qty 1

## 2019-02-08 MED ORDER — SODIUM CHLORIDE FLUSH 0.9 % IV SOLN
INTRAVENOUS | Status: AC
  Filled 2019-02-08: qty 10

## 2019-02-08 MED ORDER — LIDOCAINE HCL (PF) 0.5 % IJ SOLN
INTRAMUSCULAR | Status: DC | PRN
  Administered 2019-02-08: 50 mL via INTRAVENOUS

## 2019-02-08 MED ORDER — HYDROCODONE-ACETAMINOPHEN 5-325 MG PO TABS: 1.0000 | ORAL_TABLET | ORAL | 0 refills | Status: DC | PRN

## 2019-02-08 MED ORDER — LACTATED RINGERS IV SOLN
INTRAVENOUS | Status: DC
  Administered 2019-02-08: 15:00:00 via INTRAVENOUS

## 2019-02-08 MED ORDER — FAMOTIDINE 20 MG PO TABS
20.0000 mg | ORAL_TABLET | Freq: Once | ORAL | Status: AC
  Administered 2019-02-08: 20 mg via ORAL

## 2019-02-08 SURGICAL SUPPLY — 26 items
BANDAGE ELASTIC 3 LF NS (GAUZE/BANDAGES/DRESSINGS) ×2 IMPLANT
BNDG ESMARK 4X12 TAN STRL LF (GAUZE/BANDAGES/DRESSINGS) ×2 IMPLANT
CANISTER SUCT 1200ML W/VALVE (MISCELLANEOUS) ×2 IMPLANT
CAST PADDING 3X4FT ST 30246 (SOFTGOODS) ×1
COVER WAND RF STERILE (DRAPES) ×2 IMPLANT
CUFF TOURN SGL QUICK 18X4 (TOURNIQUET CUFF) ×2 IMPLANT
DRSG DERMACEA 8X12 NADH (GAUZE/BANDAGES/DRESSINGS) ×2 IMPLANT
DURAPREP 26ML APPLICATOR (WOUND CARE) ×2 IMPLANT
ELECT CAUTERY BLADE 6.4 (BLADE) ×2 IMPLANT
ELECT REM PT RETURN 9FT ADLT (ELECTROSURGICAL) ×2
ELECTRODE REM PT RTRN 9FT ADLT (ELECTROSURGICAL) ×1 IMPLANT
GAUZE SPONGE 4X4 12PLY STRL (GAUZE/BANDAGES/DRESSINGS) ×2 IMPLANT
GLOVE BIOGEL M STRL SZ7.5 (GLOVE) ×2 IMPLANT
GLOVE INDICATOR 8.0 STRL GRN (GLOVE) ×2 IMPLANT
GOWN STRL REUS W/ TWL LRG LVL3 (GOWN DISPOSABLE) ×2 IMPLANT
GOWN STRL REUS W/TWL LRG LVL3 (GOWN DISPOSABLE) ×2
KIT TURNOVER KIT A (KITS) ×2 IMPLANT
NS IRRIG 500ML POUR BTL (IV SOLUTION) ×2 IMPLANT
PACK EXTREMITY ARMC (MISCELLANEOUS) ×2 IMPLANT
PAD CAST CTTN 3X4 STRL (SOFTGOODS) ×1 IMPLANT
SOL PREP PVP 2OZ (MISCELLANEOUS) ×2
SOLUTION PREP PVP 2OZ (MISCELLANEOUS) ×1 IMPLANT
SPLINT CAST 1 STEP 3X12 (MISCELLANEOUS) ×2 IMPLANT
STOCKINETTE 48X4 2 PLY STRL (GAUZE/BANDAGES/DRESSINGS) ×1 IMPLANT
STOCKINETTE STRL 4IN 9604848 (GAUZE/BANDAGES/DRESSINGS) ×2 IMPLANT
SUT ETHILON 5-0 FS-2 18 BLK (SUTURE) ×2 IMPLANT

## 2019-02-08 NOTE — Anesthesia Postprocedure Evaluation (Signed)
Anesthesia Post Note  Patient: Renee Abbott  Procedure(s) Performed: CARPAL TUNNEL RELEASE LEFT (Left Wrist)  Patient location during evaluation: PACU Anesthesia Type: General Level of consciousness: awake and alert Pain management: pain level controlled Vital Signs Assessment: post-procedure vital signs reviewed and stable Respiratory status: spontaneous breathing, nonlabored ventilation, respiratory function stable and patient connected to nasal cannula oxygen Cardiovascular status: blood pressure returned to baseline and stable Postop Assessment: no apparent nausea or vomiting Anesthetic complications: no     Last Vitals:  Vitals:   02/08/19 1800 02/08/19 1806  BP: 138/84 139/70  Pulse: 71 71  Resp: 20   Temp: 36.4 C (!) 36.1 C  SpO2: 100% 100%    Last Pain:  Vitals:   02/08/19 1806  TempSrc: Tympanic  PainSc: 0-No pain                 Lenard Simmer

## 2019-02-08 NOTE — Anesthesia Post-op Follow-up Note (Signed)
Anesthesia QCDR form completed.        

## 2019-02-08 NOTE — Op Note (Signed)
OPERATIVE NOTE  DATE OF SURGERY:  02/08/2019  PATIENT NAME:  Renee Abbott   DOB: [birth year]  MRN: [id number]  PRE-OPERATIVE DIAGNOSIS: Left carpal tunnel syndrome  POST-OPERATIVE DIAGNOSIS:  Same  PROCEDURE:  Left carpal tunnel release  SURGEON:  Jena Gauss. M.D.  ANESTHESIA: Bier block  ESTIMATED BLOOD LOSS: Minimal  FLUIDS REPLACED: 400 mL of crystalloid  TOURNIQUET TIME: 44 minutes  DRAINS: None  INDICATIONS FOR SURGERY: Renee Abbott is a 58 y.o. year old female with a long history of numbness and paresthesias to the left hand. EMG/nerve conduction studies demonstrated findings consistent with carpal tunnel syndrome.The patient had not seen any significant improvement despite conservative nonsurgical intervention. After discussion of the risks and benefits of surgical intervention, the patient expressed understanding of the risks benefits and agree with plans for carpal tunnel release.   PROCEDURE IN DETAIL: The patient was brought into the operating room and after adequate Bier block anesthesia, the left hand and arm were prepped with alcohol and Duraprep and draped in the usual sterile fashion. A "time-out" was performed as per usual protocol. The hand and forearm were exsanguinated using an Esmarch and the tourniquet was inflated to 250 mmHg. Loupe magnification was used throughout the procedure. An incision was made just ulnar to the thenar palmar crease. Dissection was carried down through the palmar fascia to the transverse carpal ligament. The transverse carpal ligament was sharply incised, taking care to protect the underlying structures with the carpal tunnel. Complete release of the transverse carpal ligament was achieved. There was no evidence of ganglion cyst or lipoma within the carpal tunnel. The wound was irrigated with copious amounts of normal saline with antibiotic solution. The skin was then re-approximated with interrupted sutures of #5-0 nylon. A  sterile dressing was applied followed by application of a volar splint. The tourniquet was deflated with a total tourniquet time of 44 minutes.  The patient tolerated the procedure well and was transported to the PACU in stable condition.  Jennifer Payes P. Angie Fava., M.D.

## 2019-02-08 NOTE — Anesthesia Procedure Notes (Signed)
Anesthesia Regional Block: Bier block (IV Regional)   Pre-Anesthetic Checklist: ,, timeout performed, Correct Patient, Correct Site, Correct Laterality, Correct Procedure, Correct Position, site marked, Risks and benefits discussed,  Surgical consent,  Pre-op evaluation,  At surgeon's request and post-op pain management  Laterality: Left  Prep: chloraprep       Needles:  Injection technique: Catheter      Needle Gauge: 20     Additional Needles:   Procedures:,,,,,, Esmarch exsanguination,, double tourniquet utilized  Narrative:   Performed by: Personally  CRNA: Dulcy Fanny, RN

## 2019-02-08 NOTE — Discharge Instructions (Signed)
AMBULATORY SURGERY  °DISCHARGE INSTRUCTIONS ° ° °1) The drugs that you were given will stay in your system until tomorrow so for the next 24 hours you should not: ° °A) Drive an automobile °B) Make any legal decisions °C) Drink any alcoholic beverage ° ° °2) You may resume regular meals tomorrow.  Today it is better to start with liquids and gradually work up to solid foods. ° °You may eat anything you prefer, but it is better to start with liquids, then soup and crackers, and gradually work up to solid foods. ° ° °3) Please notify your doctor immediately if you have any unusual bleeding, trouble breathing, redness and pain at the surgery site, drainage, fever, or pain not relieved by medication. °4)  ° °5) Your post-operative visit with Dr.                     °           °     is: Date:                        Time:   ° °Please call to schedule your post-operative visit. ° °6) Additional Instructions: ° ° ° ° ° °Instructions after Hand / Wrist Surgery ° ° James P. Hooten, Jr., M.D. ° Dept. of Orthopaedics & Sports Medicine ° [facility] ° [address] ° [place], [place]  [address] ° ° Phone: [phone]   Fax: [phone] ° ° °DIET: °• Drink plenty of non-alcoholic fluids & begin a light diet. °• Resume your normal diet the day after surgery. ° °ACTIVITY:  °• Keep the hand elevated above the level of the elbow. °• Begin gently moving the fingers on a regular basis to avoid stiffness. °• Avoid any heavy lifting, pushing, or pulling with the operative hand. °• Do not drive or operate any equipment until instructed. ° °WOUND CARE:  °• Keep the splint/bandage clean and dry.  °• The splint and stitches will be removed in the office. °• Continue to use the ice packs periodically to reduce pain and swelling. °• You may bathe or shower after the stitches are removed at the first office visit following surgery. ° °MEDICATIONS: °• You may resume your regular medications. °• Please take the pain medication as  prescribed. °• Do not take pain medication on an empty stomach. °• Do not drive or drink alcoholic beverages when taking pain medications. ° °CALL THE OFFICE FOR: °• Temperature above 101 degrees °• Excessive bleeding or drainage on the dressing. °• Excessive swelling, coldness, or paleness of the fingers. °• Persistent nausea and vomiting. ° °FOLLOW-UP:  °• You should have an appointment to return to the office in 7-10 days after surgery.  ° °REMEMBER: R.I.C.E. = Rest, Ice, Compression, Elevation !  °

## 2019-02-08 NOTE — OR Nursing (Signed)
Incentive spirometer given and explained with return demonstration. 

## 2019-02-08 NOTE — Transfer of Care (Signed)
Immediate Anesthesia Transfer of Care Note  Patient: Renee Abbott  Procedure(s) Performed: CARPAL TUNNEL RELEASE LEFT (Left Wrist)  Patient Location: PACU  Anesthesia Type:Bier block  Level of Consciousness: awake, alert  and oriented  Airway & Oxygen Therapy: Patient connected to face mask oxygen  Post-op Assessment: Post -op Vital signs reviewed and stable  Post vital signs: stable  Last Vitals:  Vitals Value Taken Time  BP 158/142 02/08/2019  5:27 PM  Temp    Pulse 85 02/08/2019  5:27 PM  Resp 16 02/08/2019  5:27 PM  SpO2 99 % 02/08/2019  5:27 PM  Vitals shown include unvalidated device data.  Last Pain:  Vitals:   02/08/19 1426  TempSrc: Tympanic  PainSc: 0-No pain         Complications: No apparent anesthesia complications

## 2019-02-08 NOTE — H&P (Signed)
The patient has been re-examined, and the chart reviewed, and there have been no interval changes to the documented history and physical.    The risks, benefits, and alternatives have been discussed at length. The patient expressed understanding of the risks benefits and agreed with plans for surgical intervention.  James P. Hooten, Jr. M.D.    

## 2019-02-08 NOTE — Anesthesia Preprocedure Evaluation (Addendum)
Anesthesia Evaluation  Patient identified by MRN, date of birth, ID band Patient awake    Reviewed: Allergy & Precautions, H&P , NPO status , Patient's Chart, lab work & pertinent test results  Airway Mallampati: III  TM Distance: >3 FB     Dental  (+) Teeth Intact   Pulmonary neg pulmonary ROS, former smoker,           Cardiovascular negative cardio ROS       Neuro/Psych negative neurological ROS  negative psych ROS   GI/Hepatic negative GI ROS, Neg liver ROS,   Endo/Other  negative endocrine ROS  Renal/GU      Musculoskeletal   Abdominal   Peds  Hematology negative hematology ROS (+)   Anesthesia Other Findings Past Medical History: No date: Multiple thyroid nodules     Comment:  followed by pcp-- per pt ultrasound done 02-24-2018 in               epic No date: PMB (postmenopausal bleeding) No date: Wears contact lenses  Past Surgical History: 2010: [place]; Right 03/29/2018: DILATATION & CURETTAGE/HYSTEROSCOPY WITH MYOSURE; N/A     Comment:  Procedure: DILATATION & CURETTAGE/HYSTEROSCOPY WITH               MYOSURE;  Surgeon: Salvadore Dom, MD;  Location:               [facility];  Service: Gynecology;                Laterality: N/A; No date: HYSTEROSCOPY     Reproductive/Obstetrics negative OB ROS                            Anesthesia Physical Anesthesia Plan  ASA: II  Anesthesia Plan: MAC   Post-op Pain Management:    Induction:   PONV Risk Score and Plan: Treatment may vary due to age or medical condition  Airway Management Planned: Nasal Cannula  Additional Equipment:   Intra-op Plan:   Post-operative Plan:   Informed Consent: I have reviewed the patients History and Physical, chart, labs and discussed the procedure including the risks, benefits and alternatives for the proposed anesthesia with the patient or authorized  representative who has indicated his/her understanding and acceptance.     Dental Advisory Given  Plan Discussed with: Anesthesiologist and CRNA  Anesthesia Plan Comments: (Pt requests a Bier block due to her experience of prolonged "brain fog" with her last anesthetic for a D&C.)       Anesthesia Quick Evaluation

## 2019-02-09 ENCOUNTER — Encounter: Payer: Self-pay | Admitting: Orthopedic Surgery

## 2019-02-12 ENCOUNTER — Encounter: Payer: Self-pay | Admitting: Orthopedic Surgery

## 2019-03-26 DIAGNOSIS — Z9889 Other specified postprocedural states: Secondary | ICD-10-CM | POA: Insufficient documentation

## 2019-06-20 ENCOUNTER — Encounter: Payer: Self-pay | Admitting: Obstetrics and Gynecology

## 2019-06-20 ENCOUNTER — Ambulatory Visit (INDEPENDENT_AMBULATORY_CARE_PROVIDER_SITE_OTHER): Payer: BC Managed Care – PPO

## 2019-06-20 ENCOUNTER — Other Ambulatory Visit: Payer: Self-pay

## 2019-06-20 ENCOUNTER — Ambulatory Visit: Payer: BC Managed Care – PPO | Admitting: Obstetrics and Gynecology

## 2019-06-20 VITALS — BP 112/76 | HR 76 | Temp 98.3°F | Wt 157.0 lb

## 2019-06-20 DIAGNOSIS — N83202 Unspecified ovarian cyst, left side: Secondary | ICD-10-CM

## 2019-06-20 NOTE — Progress Notes (Signed)
GYNECOLOGY  VISIT   HPI: 58 y.o.   Married White or Caucasian Not Hispanic or Latino  female   G1P1001 with Patient's last menstrual period was 08/30/2014 (exact date).   here for  Consult following PUS. She was incidentally noted to have a 2 cm, simple left ovarian cyst on ultrasound in 4/19. The ultrasound was done for PMP bleeding. She had a normal CA 125 and PMP FSH. No c/o  GYNECOLOGIC HISTORY: Patient's last menstrual period was 08/30/2014 (exact date). Contraception: Postmenopausal Menopausal hormone therapy: None        OB History    Gravida  1   Para  1   Term  1   Preterm      AB      Living  1     SAB      TAB      Ectopic      Multiple      Live Births  1              Patient Active Problem List   Diagnosis Date Noted  . Carpal tunnel syndrome of left wrist 01/29/2019  . LBP (low back pain) 10/05/2017  . Arthralgia of hip 10/05/2017    Past Medical History:  Diagnosis Date  . Multiple thyroid nodules    followed by pcp-- per pt ultrasound done 02-24-2018 in epic  . PMB (postmenopausal bleeding)   . Wears contact lenses     Past Surgical History:  Procedure Laterality Date  . CARPAL TUNNEL RELEASE Right 2010  . CARPAL TUNNEL RELEASE Left 02/08/2019   Procedure: CARPAL TUNNEL RELEASE LEFT;  Surgeon: Donato HeinzHooten, James P, MD;  Location: [facility];  Service: Orthopedics;  Laterality: Left;  . DILATATION & CURETTAGE/HYSTEROSCOPY WITH MYOSURE N/A 03/29/2018   Procedure: DILATATION & CURETTAGE/HYSTEROSCOPY WITH MYOSURE;  Surgeon: Romualdo BolkJertson,  Evelyn, MD;  [facility];  Service: Gynecology;  Laterality: N/A;  . HYSTEROSCOPY      Current Outpatient Medications  Medication Sig Dispense Refill  . cholecalciferol (VITAMIN D3) 25 MCG (1000 UT) tablet Take 1,000 Units by mouth daily.    Marland Kitchen. ELDERBERRY PO Take 15 mLs by mouth daily.    Marland Kitchen. ketotifen (ALAWAY) 0.025 % ophthalmic solution Place 1 drop into the right eye daily as needed  (allergies).    . loratadine (CLARITIN) 10 MG tablet Take 10 mg by mouth daily as needed for allergies.    . naproxen sodium (ANAPROX) 220 MG tablet Take 440 mg by mouth daily.      No current facility-administered medications for this visit.      ALLERGIES: Patient has no known allergies.  Family History  Problem Relation Age of Onset  . Hypertension Mother   . Thyroid disease Mother   . Diabetes Father   . Cancer Father [phone]       Dec metastatic Lung CA 2015prostate, Lung dx 06/26/14  . Hypertension Father   . Heart disease Father     Social History   Socioeconomic History  . Marital status: Married    Spouse name: Not on file  . Number of children: Not on file  . Years of education: Not on file  . Highest education level: Not on file  Occupational History  . Not on file  Social Needs  . Financial resource strain: Not on file  . Food insecurity    Worry: Not on file    Inability: Not on file  . Transportation needs  Medical: Not on file    Non-medical: Not on file  Tobacco Use  . Smoking status: Former Smoker    Packs/day: 0.50    Years: 6.00    Pack years: 3.00    Types: Cigarettes    Quit date: 03/24/1991    Years since quitting: 28.2  . Smokeless tobacco: Never Used  Substance and Sexual Activity  . Alcohol use: Yes    Alcohol/week: 0.0 standard drinks    Comment: wine occ  . Drug use: Never  . Sexual activity: Not Currently    Partners: Male    Birth control/protection: Post-menopausal    Comment: husband vasectomy  Lifestyle  . Physical activity    Days per week: Not on file    Minutes per session: Not on file  . Stress: Not on file  Relationships  . Social Herbalist on phone: Not on file    Gets together: Not on file    Attends religious service: Not on file    Active member of club or organization: Not on file    Attends meetings of clubs or organizations: Not on file    Relationship status: Not on file  . Intimate partner  violence    Fear of current or ex partner: Not on file    Emotionally abused: Not on file    Physically abused: Not on file    Forced sexual activity: Not on file  Other Topics Concern  . Not on file  Social History Narrative  . Not on file    Review of Systems  Constitutional: Negative.   HENT: Negative.   Eyes: Negative.   Respiratory: Negative.   Cardiovascular: Negative.   Gastrointestinal: Negative.   Genitourinary: Negative.   Musculoskeletal: Negative.   Skin: Negative.   Neurological: Negative.   Endo/Heme/Allergies: Negative.   Psychiatric/Behavioral: Negative.     PHYSICAL EXAMINATION:    BP 112/76 (BP Location: Right Arm, Patient Position: Sitting, Cuff Size: Normal)   Pulse 76   Temp 98.3 F (36.8 C) (Skin)   Wt 157 lb (71.2 kg)   LMP 08/30/2014 (Exact Date)   BMI 28.72 kg/m     General appearance: alert, cooperative and appears stated age  Ultrasound images reviewed with the patient. Stable, benign, simple appearing left ovarian cyst.   ASSESSMENT Stable 2 cm, simple, benign appearing cyst. No change in 15 months    PLAN No f/u needed F/U with Ms Hollice Espy for an annual exam in 11/20   An After Visit Summary was printed and given to the patient.  CC: Evalee Mutton, CNM

## 2019-10-16 ENCOUNTER — Other Ambulatory Visit: Payer: Self-pay

## 2019-10-17 ENCOUNTER — Ambulatory Visit: Payer: BC Managed Care – PPO | Admitting: Certified Nurse Midwife

## 2019-10-17 ENCOUNTER — Encounter: Payer: Self-pay | Admitting: Certified Nurse Midwife

## 2019-10-17 ENCOUNTER — Other Ambulatory Visit (HOSPITAL_COMMUNITY)
Admission: RE | Admit: 2019-10-17 | Discharge: 2019-10-17 | Disposition: A | Discharge status: Home / Self Care | Payer: BC Managed Care – PPO | Source: Ambulatory Visit | Attending: Certified Nurse Midwife | Admitting: Certified Nurse Midwife

## 2019-10-17 VITALS — BP 108/64 | HR 68 | Temp 97.1°F | Resp 16 | Ht 62.25 in | Wt 158.0 lb

## 2019-10-17 DIAGNOSIS — E559 Vitamin D deficiency, unspecified: Secondary | ICD-10-CM | POA: Diagnosis not present

## 2019-10-17 DIAGNOSIS — Z0000 Encounter for general adult medical examination without abnormal findings: Secondary | ICD-10-CM

## 2019-10-17 DIAGNOSIS — Z124 Encounter for screening for malignant neoplasm of cervix: Secondary | ICD-10-CM | POA: Insufficient documentation

## 2019-10-17 DIAGNOSIS — E041 Nontoxic single thyroid nodule: Secondary | ICD-10-CM | POA: Diagnosis not present

## 2019-10-17 DIAGNOSIS — Z01419 Encounter for gynecological examination (general) (routine) without abnormal findings: Secondary | ICD-10-CM

## 2019-10-17 NOTE — Progress Notes (Signed)
58 y.o. G12P1001 Married  Caucasian Fe here for annual exam. Post menopausal no vaginal bleeding or vaginal dryness.  Had carpal tunnel surgery of left hand, doing much better now. Continues to be followed with thyroid nodule on right side, no significant change. Aware she has gained some weight due to working at home, trying to eat better. Desires screening labs today. No other health issues. 2 grandchildren now!  Patient's last menstrual period was 08/30/2014 (exact date).          Sexually active: No.  The current method of family planning is vasectomy, post menopausal.    Exercising: No.  exercise Smoker:  no  Review of Systems  Constitutional: Negative.   HENT: Negative.   Eyes: Negative.   Respiratory: Negative.   Cardiovascular: Negative.   Gastrointestinal: Negative.   Genitourinary: Negative.   Musculoskeletal: Negative.   Skin: Negative.   Neurological: Negative.   Endo/Heme/Allergies: Negative.   Psychiatric/Behavioral: Negative.     Health Maintenance: Pap:  12-01-15 neg, 13-19-19 ASCUS HPV HR neg History of Abnormal Pap: no MMG:  12-05-2018 category b density birads 1:neg Self Breast exams: occ Colonoscopy:  2019 f/u 23yrs BMD:   none TDaP:  2015 Shingles: no Pneumonia: no Hep C and HIV: hep c neg 2017 Labs: yes    reports that she quit smoking about 28 years ago. Her smoking use included cigarettes. She has a 3.00 pack-year smoking history. She has never used smokeless tobacco. She reports current alcohol use. She reports that she does not use drugs.  Past Medical History:  Diagnosis Date  . Multiple thyroid nodules    followed by pcp-- per pt ultrasound done 02-24-2018 in epic  . PMB (postmenopausal bleeding)   . Wears contact lenses     Past Surgical History:  Procedure Laterality Date  . CARPAL TUNNEL RELEASE Right 2010  . CARPAL TUNNEL RELEASE Left 02/08/2019   Procedure: CARPAL TUNNEL RELEASE LEFT;  Surgeon: Dereck Leep, MD;  Location: [facility];   Service: Orthopedics;  Laterality: Left;  . DILATATION & CURETTAGE/HYSTEROSCOPY WITH MYOSURE N/A 03/29/2018   Procedure: DILATATION & CURETTAGE/HYSTEROSCOPY WITH MYOSURE;  Surgeon: Salvadore Dom, MD;  Location: [facility];  Service: Gynecology;  Laterality: N/A;  . HYSTEROSCOPY      Current Outpatient Medications  Medication Sig Dispense Refill  . cholecalciferol (VITAMIN D3) 25 MCG (1000 UT) tablet Take 1,000 Units by mouth daily.    Marland Kitchen ELDERBERRY PO Take 15 mLs by mouth daily.    Marland Kitchen ketotifen (ALAWAY) 0.025 % ophthalmic solution Place 1 drop into the right eye daily as needed (allergies).    . loratadine (CLARITIN) 10 MG tablet Take 10 mg by mouth daily as needed for allergies.    . naproxen sodium (ANAPROX) 220 MG tablet Take 440 mg by mouth daily.      No current facility-administered medications for this visit.     Family History  Problem Relation Age of Onset  . Hypertension Mother   . Thyroid disease Mother   . Diabetes Father   . Cancer Father 73       Dec metastatic Lung CA 2015prostate, Lung dx 06/26/14  . Hypertension Father   . Heart disease Father     ROS:  Pertinent items are noted in HPI.  Otherwise, a comprehensive ROS was negative.  Exam:   BP 108/64   Pulse 68   Temp (!) 97.1 F (36.2 C) (Skin)   Resp 16   Ht 5' 2.25" (  1.581 m)   Wt 158 lb (71.7 kg)   LMP 08/30/2014 (Exact Date)   BMI 28.67 kg/m  Height: 5' 2.25" (158.1 cm) Ht Readings from Last 3 Encounters:  10/17/19 5' 2.25" (1.581 m)  02/06/19 5\' 2"  (1.575 m)  10/12/18 5\' 2"  (1.575 m)    General appearance: alert, cooperative and appears stated age Head: Normocephalic, without obvious abnormality, atraumatic Neck: no adenopathy, supple, symmetrical, trachea midline and thyroid normal to inspection and palpation Lungs: clear to auscultation bilaterally Breasts: normal appearance, no masses or tenderness, No nipple retraction or dimpling, No nipple discharge or bleeding, No  axillary or supraclavicular adenopathy Heart: regular rate and rhythm Abdomen: soft, non-tender; no masses,  no organomegaly Extremities: extremities normal, atraumatic, no cyanosis or edema Skin: Skin color, texture, turgor normal. No rashes or lesions Lymph nodes: Cervical, supraclavicular, and axillary nodes normal. No abnormal inguinal nodes palpated Neurologic: Grossly normal   Pelvic: External genitalia:  no lesions              Urethra:  normal appearing urethra with no masses, tenderness or lesions              Bartholin's and Skene's: normal                 Vagina: normal appearing vagina with normal color and discharge, no lesions              Cervix: multiparous appearance, no cervical motion tenderness and no lesions              Pap taken: Yes.   Bimanual Exam:  Uterus:  normal size, contour, position, consistency, mobility, non-tender and anteverted              Adnexa: normal adnexa and no mass, fullness, tenderness               Rectovaginal: Confirms               Anus:  normal sphincter tone, no lesions  Chaperone present: yes  A:  Well Woman with normal exam  Menopausal no HRT,   Thyroid nodules under follow up with yearly 10/14/18, no significant change, patient not symptomatic at present  Recent carpal surgery on  Left hand  Follow up pap smear with ASCUS negative HPV  Screening labs  P:   Reviewed health and wellness pertinent to exam  Aware of need to advise if vaginal bleeding  Continue follow up as indicated, if she notes change in size need to advise  Continue follow up as indicated  Labs: CBC,CMP, Lipid panel, TSH, Vitamin D  Pap smear: yes   counseled on breast self exam, mammography screening, feminine hygiene, adequate intake of calcium and vitamin D, diet and exercise  return annually or prn  An After Visit Summary was printed and given to the patient.

## 2019-10-18 ENCOUNTER — Other Ambulatory Visit: Payer: Self-pay | Admitting: Certified Nurse Midwife

## 2019-10-18 ENCOUNTER — Telehealth: Payer: Self-pay

## 2019-10-18 DIAGNOSIS — E042 Nontoxic multinodular goiter: Secondary | ICD-10-CM

## 2019-10-18 DIAGNOSIS — R899 Unspecified abnormal finding in specimens from other organs, systems and tissues: Secondary | ICD-10-CM

## 2019-10-18 LAB — COMPREHENSIVE METABOLIC PANEL
ALT: 13 IU/L (ref 0–32)
AST: 21 IU/L (ref 0–40)
Albumin/Globulin Ratio: 1.8 (ref 1.2–2.2)
Albumin: 4.6 g/dL (ref 3.8–4.9)
Alkaline Phosphatase: 112 IU/L (ref 39–117)
BUN/Creatinine Ratio: 17 (ref 9–23)
BUN: 15 mg/dL (ref 6–24)
Bilirubin Total: 0.4 mg/dL (ref 0.0–1.2)
CO2: 24 mmol/L (ref 20–29)
Calcium: 9.5 mg/dL (ref 8.7–10.2)
Chloride: 102 mmol/L (ref 96–106)
Creatinine, Ser: 0.88 mg/dL (ref 0.57–1.00)
GFR calc Af Amer: 85 mL/min/{1.73_m2} (ref >59)
GFR calc non Af Amer: 74 mL/min/{1.73_m2} (ref >59)
Globulin, Total: 2.5 g/dL (ref 1.5–4.5)
Glucose: 86 mg/dL (ref 65–99)
Potassium: 4.6 mmol/L (ref 3.5–5.2)
Sodium: 142 mmol/L (ref 134–144)
Total Protein: 7.1 g/dL (ref 6.0–8.5)

## 2019-10-18 LAB — LIPID PANEL
Chol/HDL Ratio: 3.3 ratio (ref 0.0–4.4)
Cholesterol, Total: 202 mg/dL — ABNORMAL HIGH (ref 100–199)
HDL: 61 mg/dL (ref >39)
LDL Chol Calc (NIH): 126 mg/dL — ABNORMAL HIGH (ref 0–99)
Triglycerides: 84 mg/dL (ref 0–149)
VLDL Cholesterol Cal: 15 mg/dL (ref 5–40)

## 2019-10-18 LAB — CBC
Hematocrit: 39.7 % (ref 34.0–46.6)
Hemoglobin: 13.5 g/dL (ref 11.1–15.9)
MCH: 30.4 pg (ref 26.6–33.0)
MCHC: 34 g/dL (ref 31.5–35.7)
MCV: 89 fL (ref 79–97)
Platelets: 280 10*3/uL (ref 150–450)
RBC: 4.44 x10E6/uL (ref 3.77–5.28)
RDW: 11.8 % (ref 11.7–15.4)
WBC: 7.9 10*3/uL (ref 3.4–10.8)

## 2019-10-18 LAB — VITAMIN D 25 HYDROXY (VIT D DEFICIENCY, FRACTURES): Vit D, 25-Hydroxy: 32.9 ng/mL (ref 30.0–100.0)

## 2019-10-18 LAB — TSH: TSH: 5.05 u[IU]/mL — ABNORMAL HIGH (ref 0.450–4.500)

## 2019-10-18 NOTE — Patient Instructions (Signed)

## 2019-10-18 NOTE — Telephone Encounter (Signed)
-----   Message from Regina Eck, CNM sent at 10/18/2019 12:23 PM EST ----- Notify patient her CBC is normal, no anemia TSH slightly elevated recheck in 4 weeks order placed Liver, kidney and glucose profile is normal Lipid panel shows borderline cholesterol elevation at 202 normal <199,  Triglycerides normal at 84, HDL is good level at 61, LDL is elevated at 126 normal is< 99   Work on diet with increase fiber, lean meat and fish and regular exercise.  Risk ratio is still normal range at 3.3 Vitamin D is low normal at 32.9  Increase her supplement to 2000 IU Vitamin D 3 daily for better menopausal bone support

## 2019-10-18 NOTE — Telephone Encounter (Signed)
Left message for call back.

## 2019-10-19 LAB — CYTOLOGY - PAP
Comment: NEGATIVE
Diagnosis: NEGATIVE
High risk HPV: NEGATIVE

## 2019-10-19 NOTE — Telephone Encounter (Signed)
Spoke with pt. Pt given lab results and scheduled for 4 week TSH level on 11/14/19 at 3:45pm per pts request of time.   Routing to provider for final review. Patient is agreeable to disposition. Will close encounter.

## 2019-10-19 NOTE — Telephone Encounter (Signed)
Patient returning call to Joy for results. 

## 2019-11-14 ENCOUNTER — Other Ambulatory Visit (INDEPENDENT_AMBULATORY_CARE_PROVIDER_SITE_OTHER): Payer: BC Managed Care – PPO

## 2019-11-14 ENCOUNTER — Other Ambulatory Visit: Payer: Self-pay

## 2019-11-14 DIAGNOSIS — R899 Unspecified abnormal finding in specimens from other organs, systems and tissues: Secondary | ICD-10-CM

## 2019-11-14 DIAGNOSIS — E042 Nontoxic multinodular goiter: Secondary | ICD-10-CM

## 2019-11-15 LAB — THYROID PANEL WITH TSH
Free Thyroxine Index: 1.6 (ref 1.2–4.9)
T3 Uptake Ratio: 31 % (ref 24–39)
T4, Total: 5 ug/dL (ref 4.5–12.0)
TSH: 3.37 u[IU]/mL (ref 0.450–4.500)

## 2019-11-21 DIAGNOSIS — H524 Presbyopia: Secondary | ICD-10-CM | POA: Diagnosis not present

## 2019-12-11 ENCOUNTER — Encounter: Payer: Self-pay | Admitting: Certified Nurse Midwife

## 2019-12-11 DIAGNOSIS — Z1231 Encounter for screening mammogram for malignant neoplasm of breast: Secondary | ICD-10-CM | POA: Diagnosis not present

## 2020-01-31 ENCOUNTER — Ambulatory Visit
Admission: RE | Admit: 2020-01-31 | Discharge: 2020-01-31 | Disposition: A | Discharge status: Home / Self Care | Payer: BC Managed Care – PPO | Source: Ambulatory Visit | Attending: Obstetrics and Gynecology | Admitting: Obstetrics and Gynecology

## 2020-01-31 DIAGNOSIS — E042 Nontoxic multinodular goiter: Secondary | ICD-10-CM

## 2020-01-31 DIAGNOSIS — E041 Nontoxic single thyroid nodule: Secondary | ICD-10-CM | POA: Diagnosis not present

## 2020-02-01 ENCOUNTER — Other Ambulatory Visit: Payer: Self-pay

## 2020-02-01 DIAGNOSIS — E041 Nontoxic single thyroid nodule: Secondary | ICD-10-CM

## 2020-02-01 DIAGNOSIS — E010 Iodine-deficiency related diffuse (endemic) goiter: Secondary | ICD-10-CM

## 2020-02-01 DIAGNOSIS — E042 Nontoxic multinodular goiter: Secondary | ICD-10-CM

## 2020-02-01 NOTE — Progress Notes (Signed)
Referral placed for [facility] Surgery.   Encounter closed.

## 2020-02-19 ENCOUNTER — Encounter: Payer: Self-pay | Admitting: Certified Nurse Midwife

## 2020-03-06 DIAGNOSIS — E042 Nontoxic multinodular goiter: Secondary | ICD-10-CM | POA: Diagnosis not present

## 2020-03-06 DIAGNOSIS — I899 Noninfective disorder of lymphatic vessels and lymph nodes, unspecified: Secondary | ICD-10-CM | POA: Diagnosis not present

## 2020-03-06 DIAGNOSIS — E039 Hypothyroidism, unspecified: Secondary | ICD-10-CM | POA: Diagnosis not present

## 2020-04-04 ENCOUNTER — Other Ambulatory Visit: Payer: Self-pay | Admitting: Surgery

## 2020-04-04 DIAGNOSIS — E042 Nontoxic multinodular goiter: Secondary | ICD-10-CM

## 2020-04-04 DIAGNOSIS — E039 Hypothyroidism, unspecified: Secondary | ICD-10-CM

## 2020-04-04 DIAGNOSIS — I899 Noninfective disorder of lymphatic vessels and lymph nodes, unspecified: Secondary | ICD-10-CM

## 2020-04-15 ENCOUNTER — Ambulatory Visit
Admission: RE | Admit: 2020-04-15 | Discharge: 2020-04-15 | Disposition: A | Discharge status: Home / Self Care | Payer: BC Managed Care – PPO | Source: Ambulatory Visit | Attending: Surgery | Admitting: Surgery

## 2020-04-15 DIAGNOSIS — I899 Noninfective disorder of lymphatic vessels and lymph nodes, unspecified: Secondary | ICD-10-CM

## 2020-04-15 DIAGNOSIS — E039 Hypothyroidism, unspecified: Secondary | ICD-10-CM

## 2020-04-15 DIAGNOSIS — E042 Nontoxic multinodular goiter: Secondary | ICD-10-CM

## 2020-04-15 DIAGNOSIS — E041 Nontoxic single thyroid nodule: Secondary | ICD-10-CM | POA: Diagnosis not present

## 2020-04-22 IMAGING — US US THYROID
1 series · 12 of 25 positions shown · non-contrast
Comparison: 02/24/2018

CLINICAL DATA: Follow-up thyroid nodules.

EXAM:
THYROID ULTRASOUND
TECHNIQUE: Ultrasound examination of the thyroid gland and adjacent soft
tissues was performed.

[Series 1: us thyroid · 0.04mm/px · 12 of 96 slices shown]
[im 4/96]
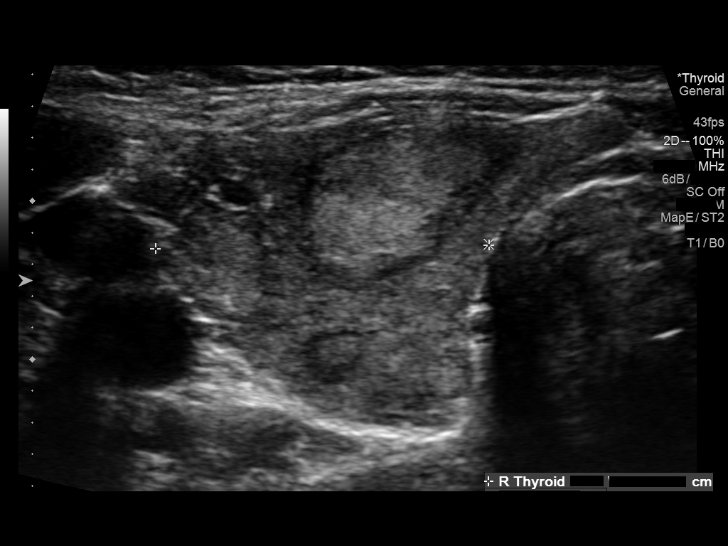
[im 12/96]
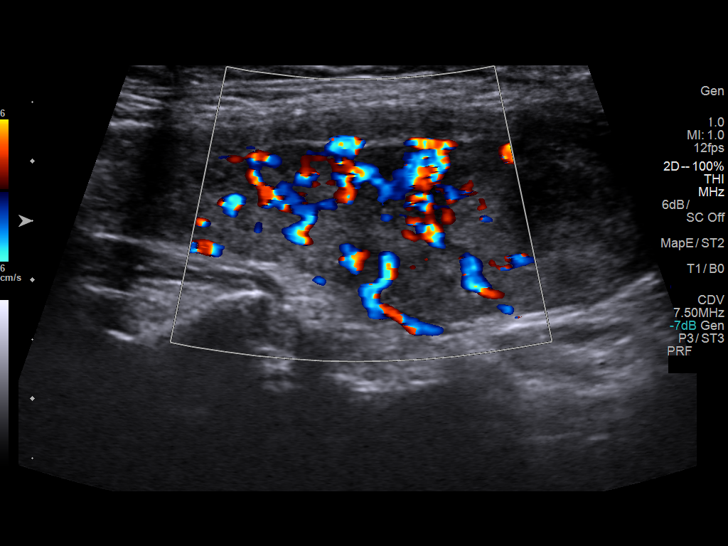
[im 20/96]
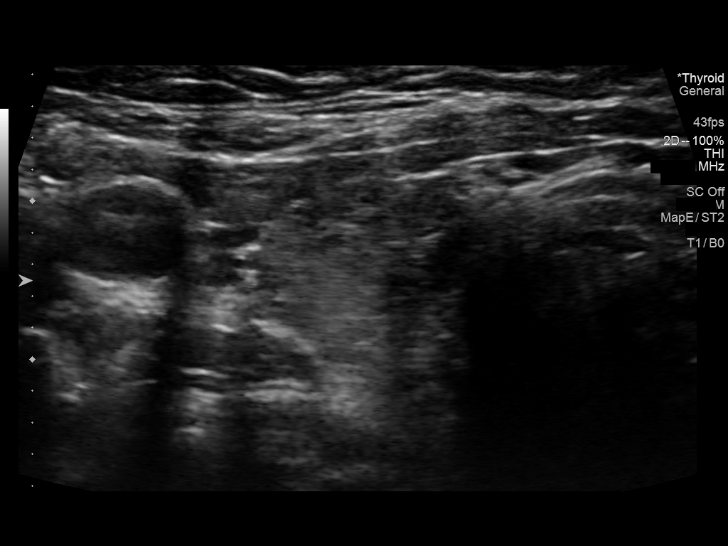
[im 28/96]
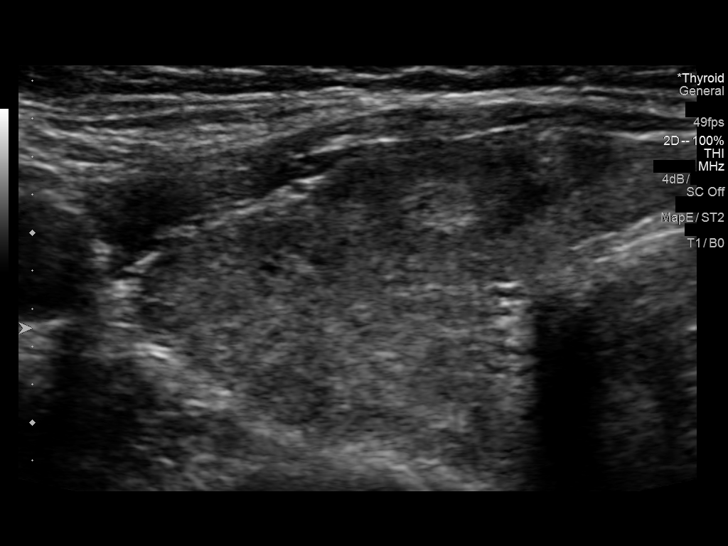
[im 36/96]
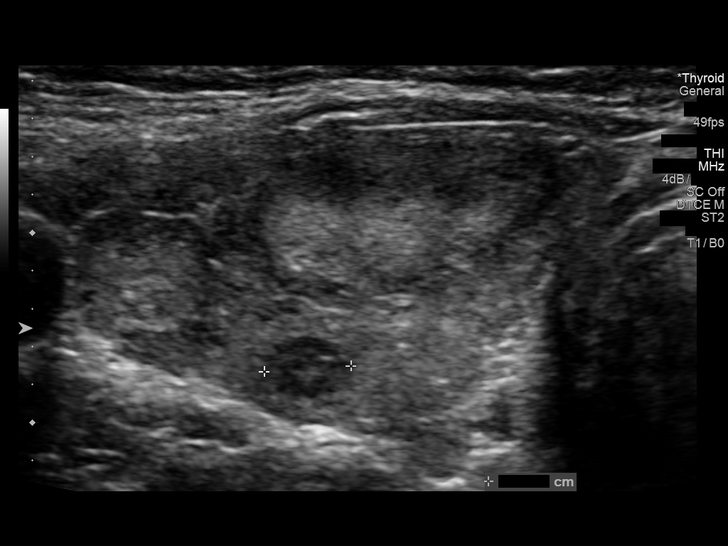
[im 44/96]
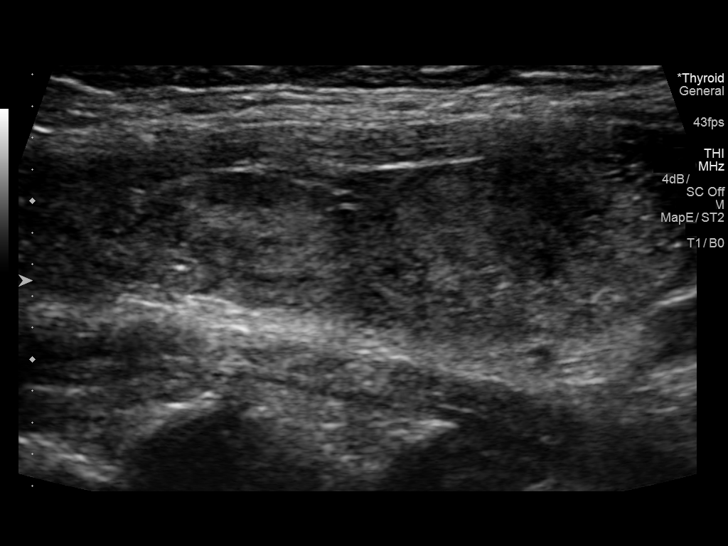
[im 52/96]
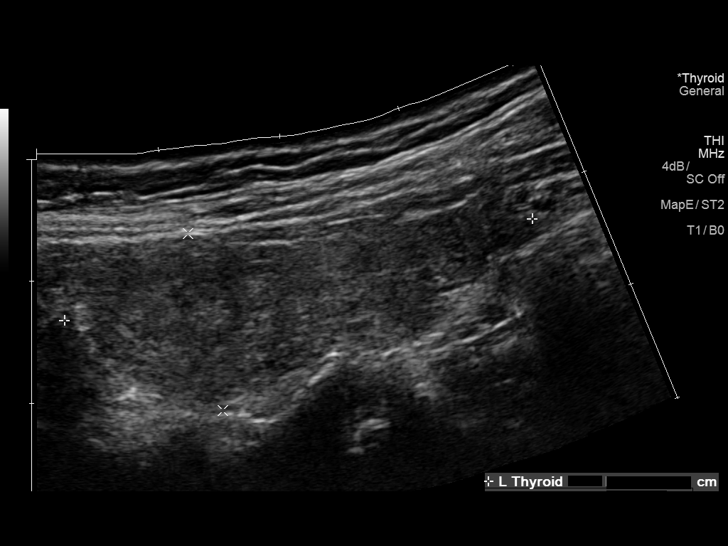
[im 60/96]
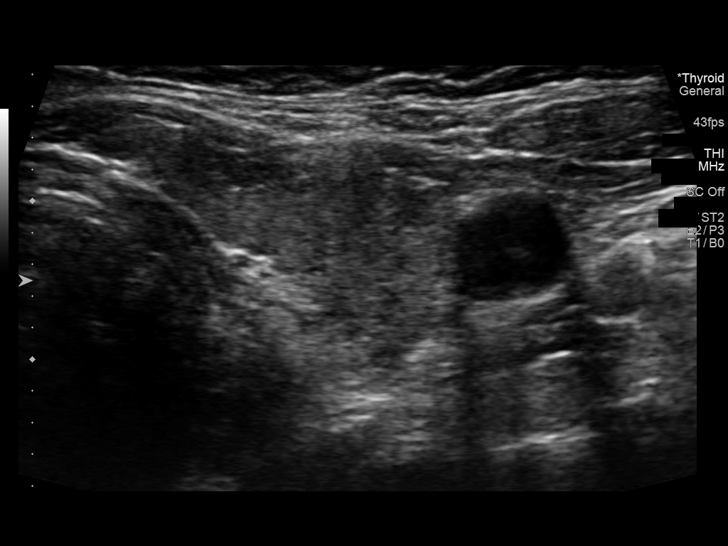
[im 68/96]
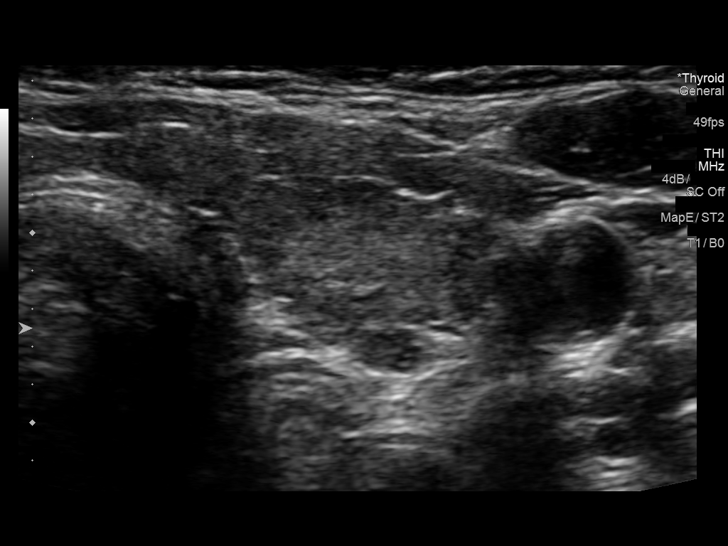
[im 76/96]
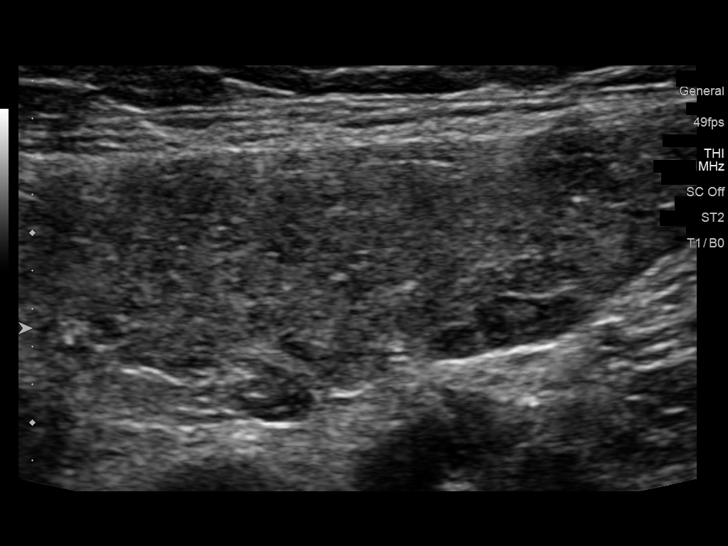
[im 84/96]
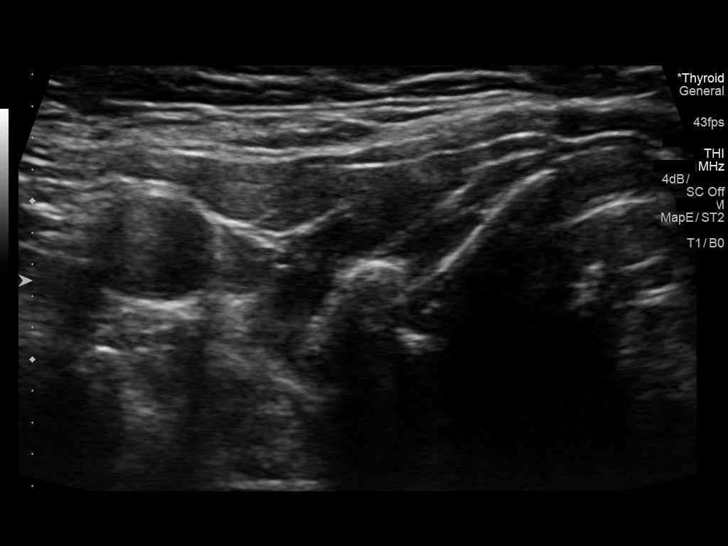
[im 92/96]
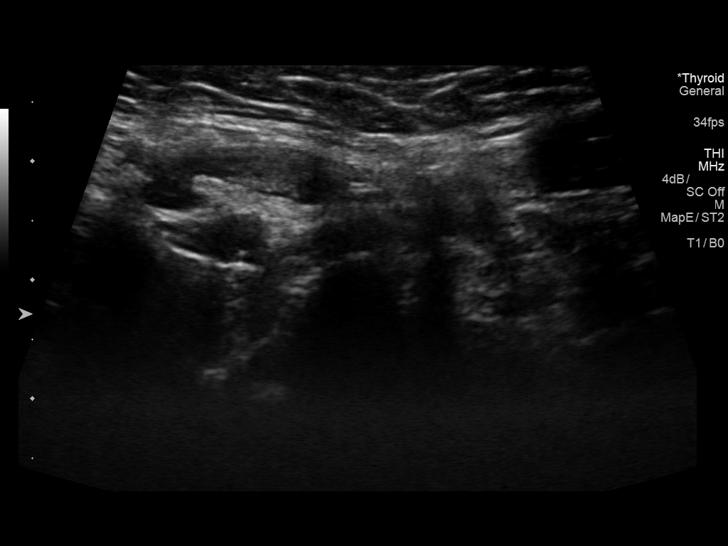

[12 of 25 positions shown; findings below may reference images not displayed]

FINDINGS: Parenchymal Echotexture: Moderately heterogenous

Isthmus: 0.2 cm, previously 0.2 cm

Right lobe: 4.9 x 1.6 x 2.1 cm, previously 4.6 x 1.4 x 2.0 cm

Left lobe: 3.9 x 1.5 x 1.7 cm, previously 4.1 x 1.3 x 1.5 cm

_________________________________________________________

Estimated total number of nodules >/= 1 cm: 3

Number of spongiform nodules >/=  2 cm not described below (TR1): 0

Number of mixed cystic and solid nodules >/= 1.5 cm not described
below (TR2): 0

_________________________________________________________

Nodule # 1:

Prior biopsy: No

Location: Right; Mid

Maximum size: 2.0 cm; Other 2 dimensions: 1.0 x 1.7 cm, previously,
1.9 x 0.8 x 1.4 cm

Composition: solid/almost completely solid (2)

Echogenicity: hyperechoic (1)

Shape: not taller-than-wide (0)

Margins: smooth (0)

Echogenic foci: none (0)

ACR TI-RADS total points: 3.

ACR TI-RADS risk category:  TR3 (3 points).

Significant change in size (>/= 20% in two dimensions and minimal
increase of 2 mm): No

Change in features: No

Change in ACR TI-RADS risk category: No

ACR TI-RADS recommendations:

*Given size (>/= 1.5 - 2.4 cm) and appearance, a follow-up
ultrasound in 1 year should be considered based on TI-RADS criteria.

_________________________________________________________

Nodule # 2:

Prior biopsy: No

Location: Right; Mid

Maximum size: 1.3 cm; Other 2 dimensions: 0.7 x 0.7 cm, previously,
1.2 x 0.7 x 0.7 cm

Composition: solid/almost completely solid (2)

Echogenicity: isoechoic (1)

Shape: not taller-than-wide (0)

Margins: ill-defined (0)

Echogenic foci: none (0)

ACR TI-RADS total points: 3.

ACR TI-RADS risk category:  TR3 (3 points).

Significant change in size (>/= 20% in two dimensions and minimal
increase of 2 mm): No

Change in features: Yes; no evidence for echogenic foci.

Change in ACR TI-RADS risk category: No

ACR TI-RADS recommendations:

Given size (<1.4 cm) and appearance, this nodule does NOT meet
TI-RADS criteria for biopsy or dedicated follow-up.

_________________________________________________________

Nodule # 4:

Prior biopsy: No

Location: Right; Inferior

Maximum size: 2.1 cm; Other 2 dimensions: 1.2 x 1.6 cm, previously,
2.0 x 1.0 x 1.2 cm

Composition: solid/almost completely solid (2)

Echogenicity: isoechoic (1)

Shape: not taller-than-wide (0)

Margins: ill-defined (0)

Echogenic foci: none (0)

ACR TI-RADS total points: 3.

ACR TI-RADS risk category:  TR3 (3 points).

Significant change in size (>/= 20% in two dimensions and minimal
increase of 2 mm): Yes

Change in features: No

Change in ACR TI-RADS risk category: No

ACR TI-RADS recommendations:

*Given size (>/= 1.5 - 2.4 cm) and appearance, a follow-up
ultrasound in 1 year should be considered based on TI-RADS criteria.

_________________________________________________________

No discrete left thyroid nodules.
IMPRESSION: Heterogeneous thyroid tissue with multiple right thyroid nodules.
Nodule 4 (inferior right thyroid nodule) has minimally enlarged in
size. Nodule 1 and Nodule 4 both meet criteria for 1 year follow-up.

The above is in keeping with the ACR TI-RADS recommendations - [HOSPITAL] 8331;[DATE].

## 2020-04-26 DIAGNOSIS — E042 Nontoxic multinodular goiter: Secondary | ICD-10-CM | POA: Diagnosis not present

## 2020-04-26 DIAGNOSIS — E039 Hypothyroidism, unspecified: Secondary | ICD-10-CM | POA: Diagnosis not present

## 2020-04-26 IMAGING — US US THYROID
1 series · 12 of 25 positions shown · non-contrast
Comparison: January 31, 2019.

CLINICAL DATA: Thyroid ultrasound follow-up

EXAM:
THYROID ULTRASOUND
TECHNIQUE: Ultrasound examination of the thyroid gland and adjacent soft
tissues was performed.

[Series 1: us thyroid · 0.08mm/px · 59 acquisitions, 12 frames shown]
[im 3/59]
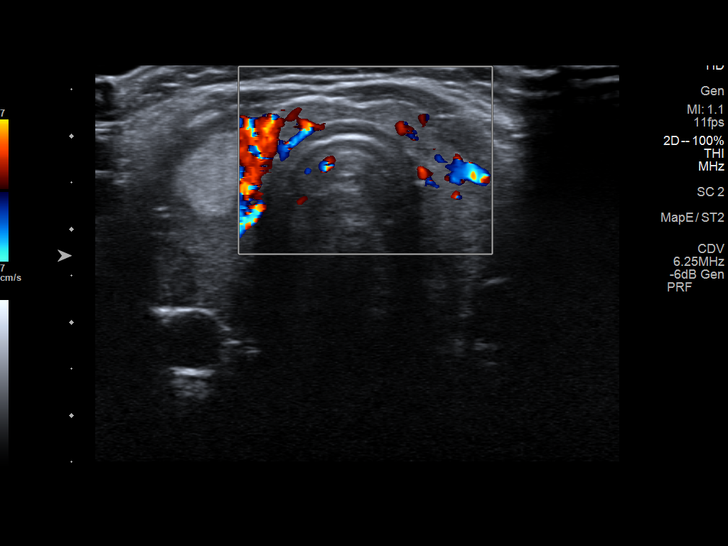
[im 8/59]
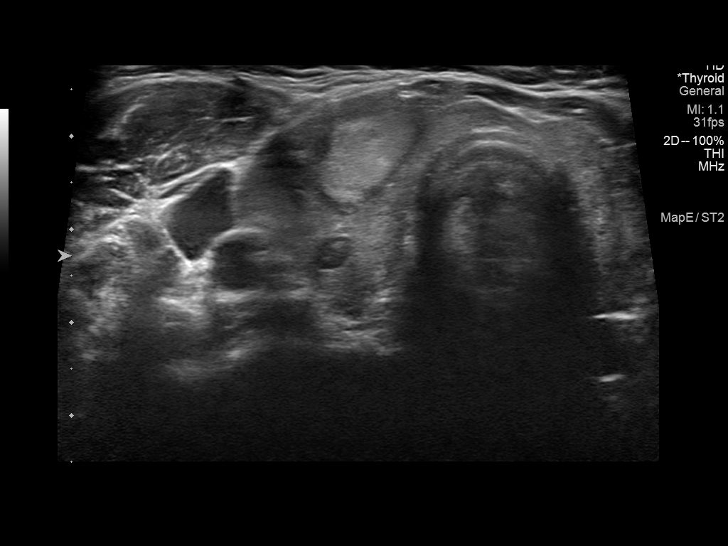
[im 13/59]
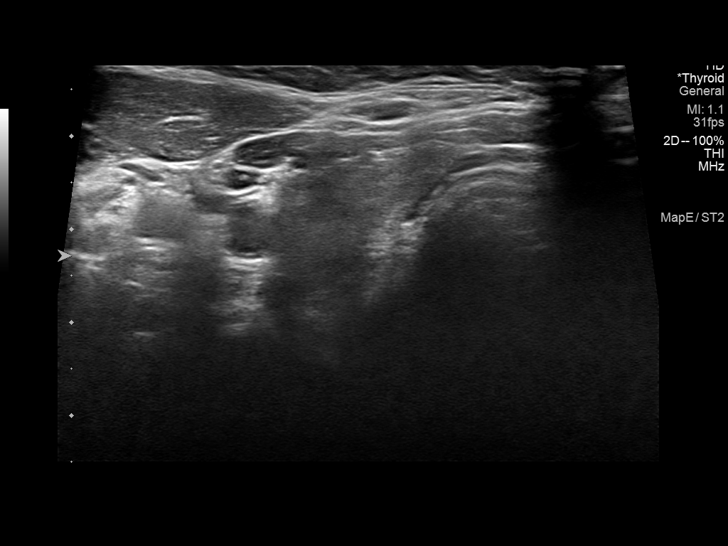
[im 17/59]
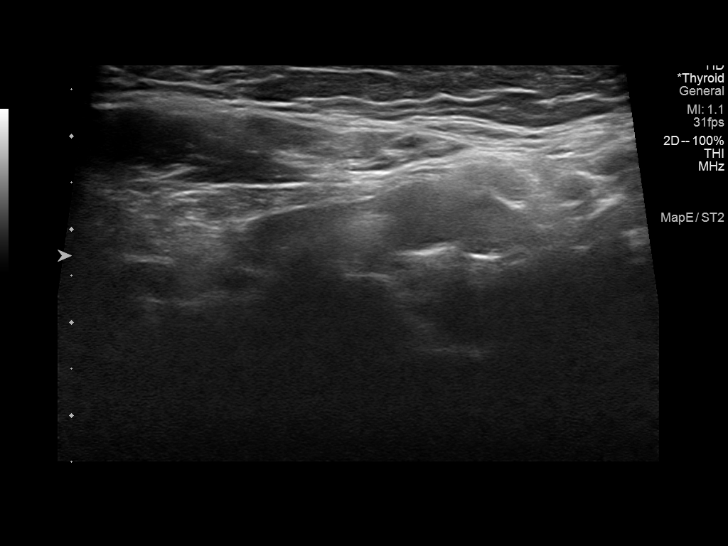
[im 22/59]
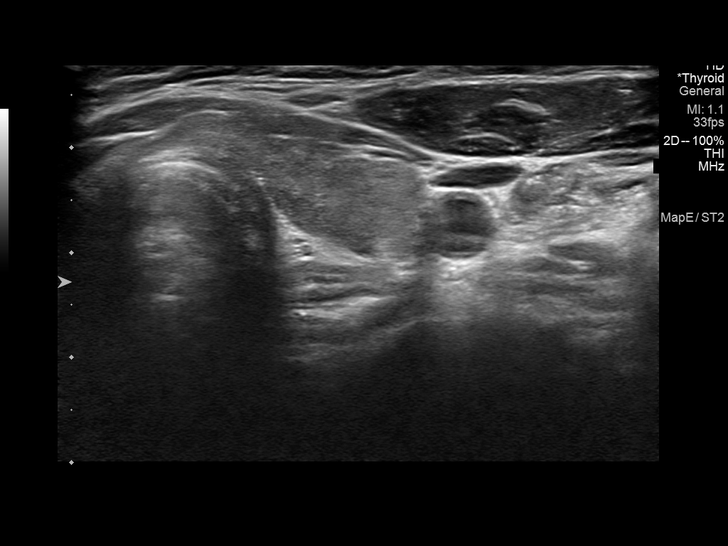
[im 27/59]
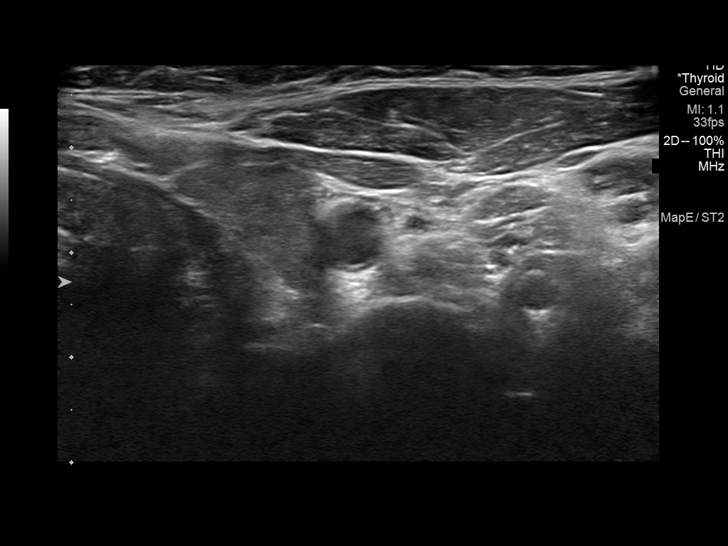
[im 32/59]
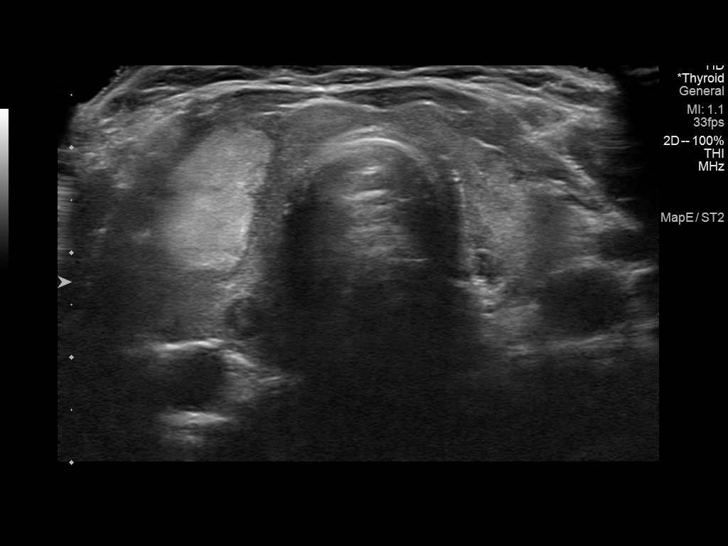
[im 37/59]
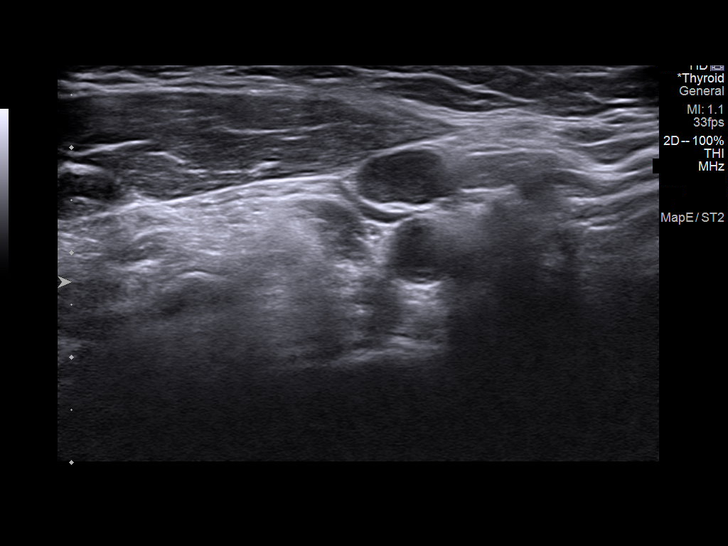
[im 42/59]
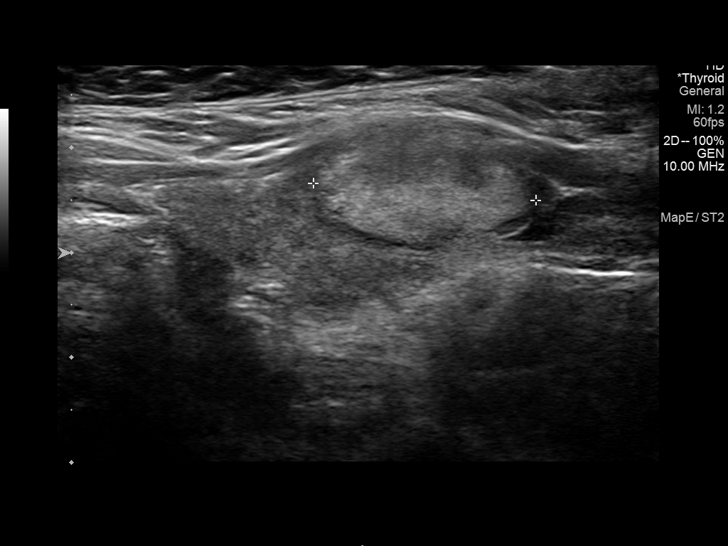
[im 46/59]
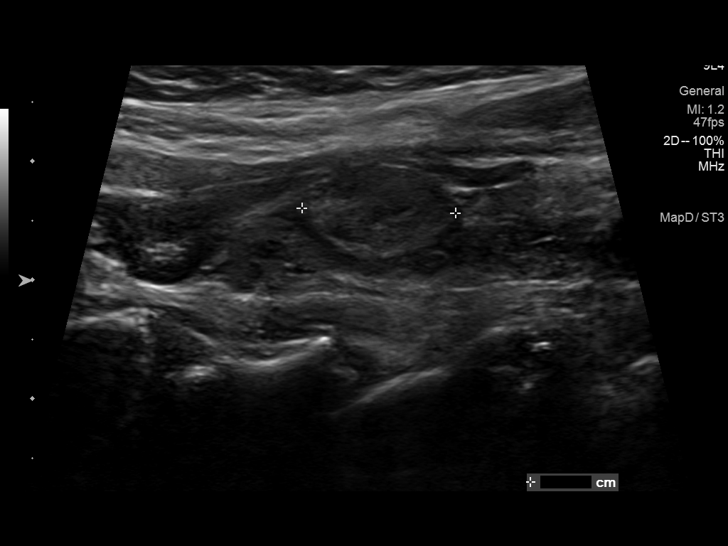
[im 51/59]
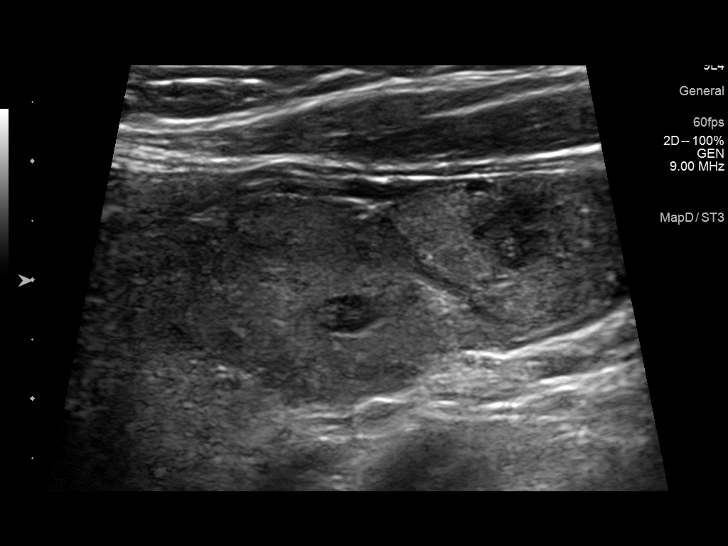
[im 56/59]
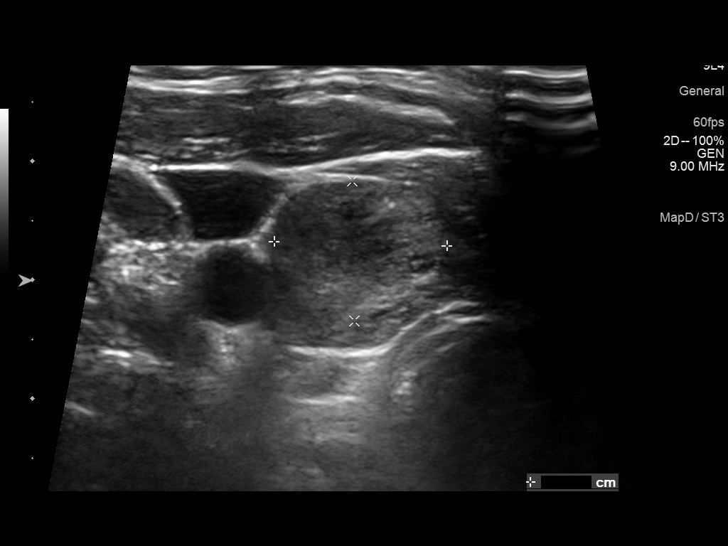

[12 of 25 positions shown; findings below may reference images not displayed]

FINDINGS: Parenchymal Echotexture: Moderately heterogenous

Isthmus: 0.3 cm

Right lobe: 4.6 x 1.7 x 1.9 cm

Left lobe: 3.8 x 1.1 x 1.5 cm

_________________________________________________________

Estimated total number of nodules >/= 1 cm: 3

Number of spongiform nodules >/=  2 cm not described below (TR1): 0

Number of mixed cystic and solid nodules >/= 1.5 cm not described
below (TR2): 0

_________________________________________________________

Nodule # 1:

Prior biopsy: No

Location: Right; Mid

Maximum size: 2.1 cm; Other 2 dimensions: 1.5 x 1.1 cm, previously,
2 x 1.7 x 1 cm

Composition: solid/almost completely solid (2)

Echogenicity: hyperechoic (1)

Shape: not taller-than-wide (0)

Margins: smooth (0)

Echogenic foci: none (0)

ACR TI-RADS total points: 3.

ACR TI-RADS risk category:  TR3 (3 points).

Significant change in size (>/= 20% in two dimensions and minimal
increase of 2 mm): No

Change in features: No

Change in ACR TI-RADS risk category: No

ACR TI-RADS recommendations:

*Given size (>/= 1.5 - 2.4 cm) and appearance, a follow-up
ultrasound in 1 year should be considered based on TI-RADS criteria.

_________________________________________________________

Nodule # 2:

Prior biopsy: No

Location: Right; Mid

Maximum size: 1.3 cm; Other 2 dimensions: 0.7 x 0.6 cm, previously,
1.3 x 0.7 x 0.7 cm

Composition: solid/almost completely solid (2)

Echogenicity: isoechoic (1)

Shape: not taller-than-wide (0)

Margins: smooth (0)

Echogenic foci: none (0)

ACR TI-RADS total points: 3.

ACR TI-RADS risk category:  TR3 (3 points).

Significant change in size (>/= 20% in two dimensions and minimal
increase of 2 mm): No

Change in features: No

Change in ACR TI-RADS risk category: No

ACR TI-RADS recommendations:

Given size (<1.4 cm) and appearance, this nodule does NOT meet
TI-RADS criteria for biopsy or dedicated follow-up.

_________________________________________________________

Nodule # 4:

Prior biopsy: No

Location: Right; Inferior

Maximum size: 2 cm; Other 2 dimensions: 1.5 x 1.2 cm, previously,
2.1 x 1.6 x 1.2 cm

Composition: solid/almost completely solid (2)

Echogenicity: isoechoic (1)

Shape: not taller-than-wide (0)

Margins: smooth (0)

Echogenic foci: none (0)

ACR TI-RADS total points: 3.

ACR TI-RADS risk category:  TR3 (3 points).

Significant change in size (>/= 20% in two dimensions and minimal
increase of 2 mm): No

Change in features: No

Change in ACR TI-RADS risk category: No

ACR TI-RADS recommendations:

*Given size (>/= 1.5 - 2.4 cm) and appearance, a follow-up
ultrasound in 1 year should be considered based on TI-RADS criteria.

_________________________________________________________

There is a 0.8 cm lymph node in the right neck without a clear fatty
hilum. The cortex is difficult to fully appreciate on this study.
IMPRESSION: 1. Stable sized, moderately heterogeneous thyroid gland as detailed
above.
2. Again noted are multiple distinct right-sided thyroid nodules,
all of which are stable from prior study. Continued 1 year follow-up
is recommended.
3. Mildly enlarged right neck lymph node without evidence for
distinct fatty hilum. Consider short interval follow-up (3 months)
versus percutaneous biopsy. This lymph node was not reliably
identified on prior studies.

The above is in keeping with the ACR TI-RADS recommendations - [HOSPITAL] 8582;[DATE].

## 2020-05-07 DIAGNOSIS — E042 Nontoxic multinodular goiter: Secondary | ICD-10-CM | POA: Diagnosis not present

## 2020-05-07 DIAGNOSIS — E039 Hypothyroidism, unspecified: Secondary | ICD-10-CM | POA: Diagnosis not present

## 2020-05-07 DIAGNOSIS — I899 Noninfective disorder of lymphatic vessels and lymph nodes, unspecified: Secondary | ICD-10-CM | POA: Diagnosis not present

## 2020-07-10 IMAGING — US US THYROID
1 series · 14 of 25 positions shown · non-contrast
Comparison: Prior thyroid ultrasound 01/31/2020, 01/31/2019 and
02/24/2018
COMPARISON: Prior thyroid ultrasound 01/31/2020, 01/31/2019 and
02/24/2018

Addendum:
CLINICAL DATA: Prior ultrasound follow-up. 58-year-old female with
several right-sided thyroid nodules currently under annual
surveillance.

EXAM:
THYROID ULTRASOUND
TECHNIQUE: Ultrasound examination of the thyroid gland and adjacent soft
tissues was performed.

[Series 1: us thyroid · 0.05mm/px · 14 of 49 slices shown]
[im 1/49]
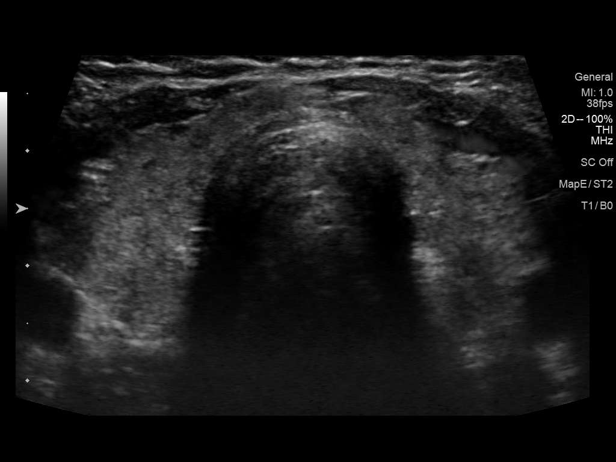
[im 5/49]
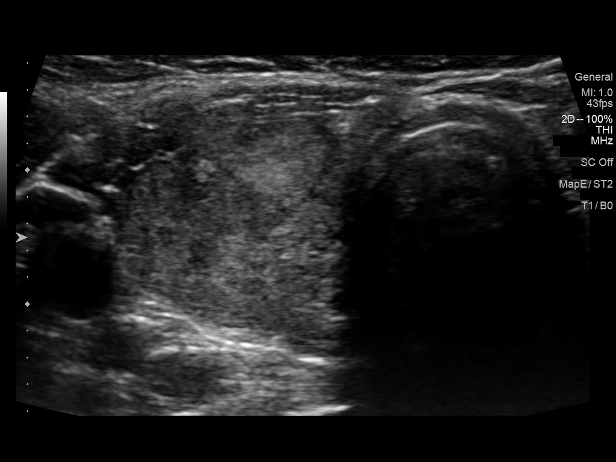
[im 9/49]
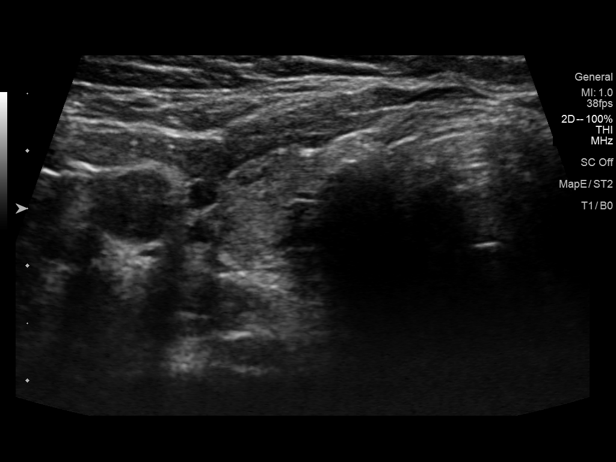
[im 13/49]
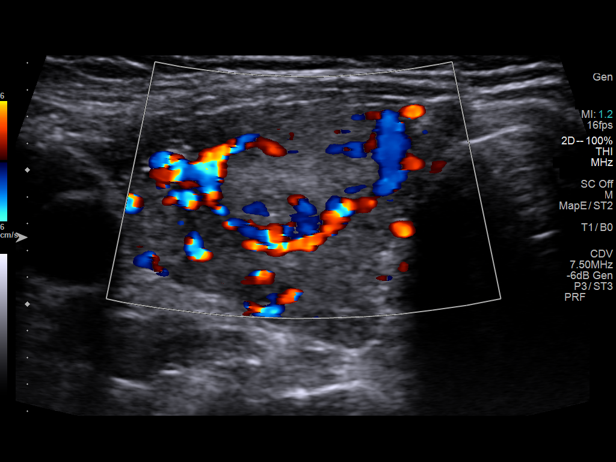
[im 17/49]
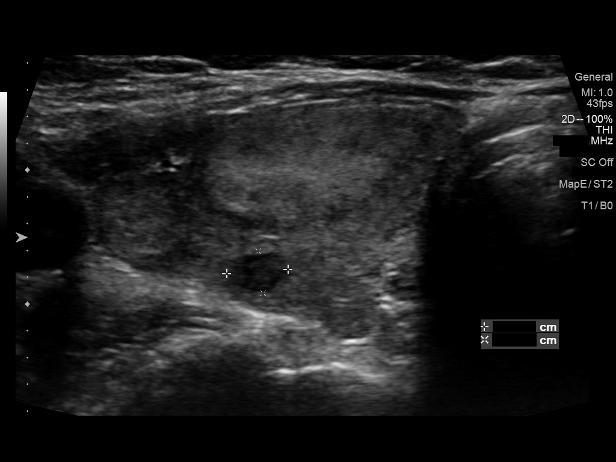
[im 19/49]
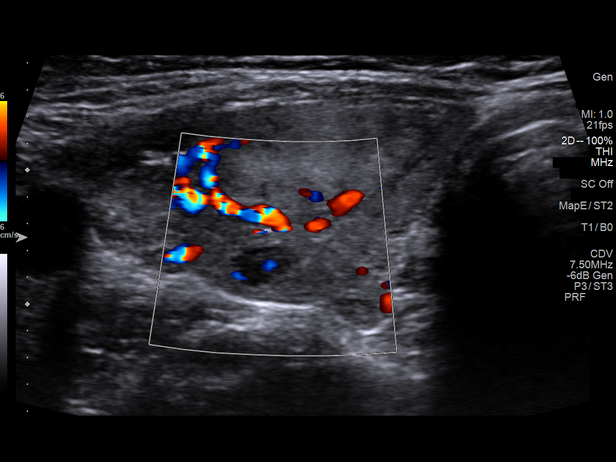
[im 23/49]
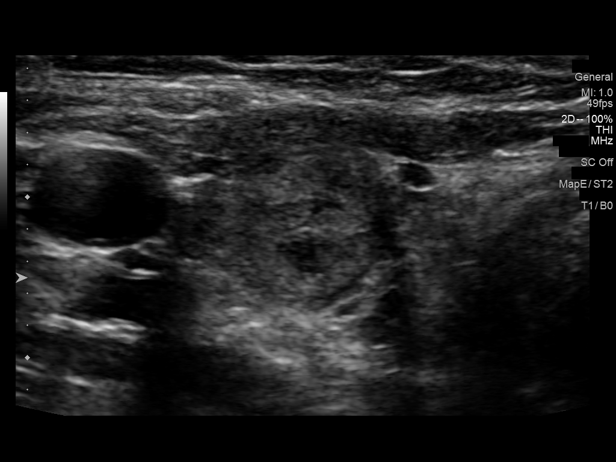
[im 27/49]
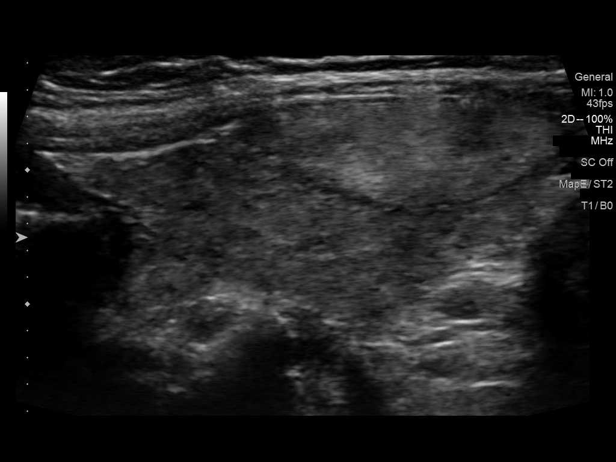
[im 31/49]
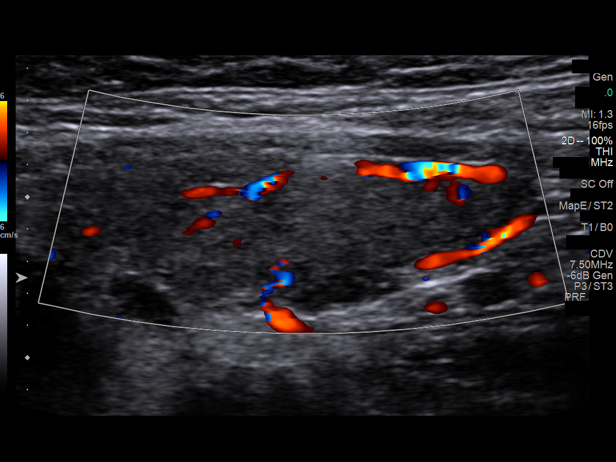
[im 33/49]
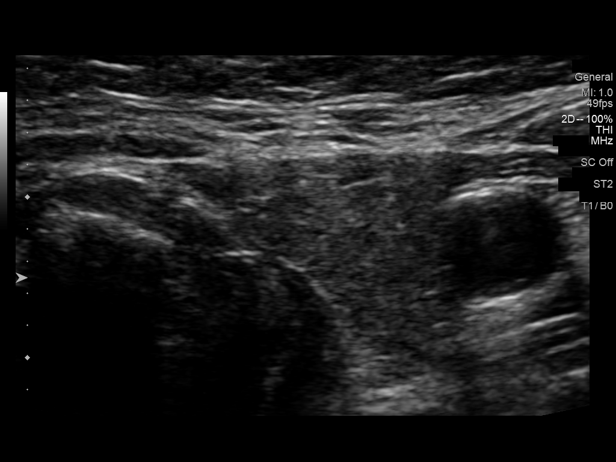
[im 37/49]
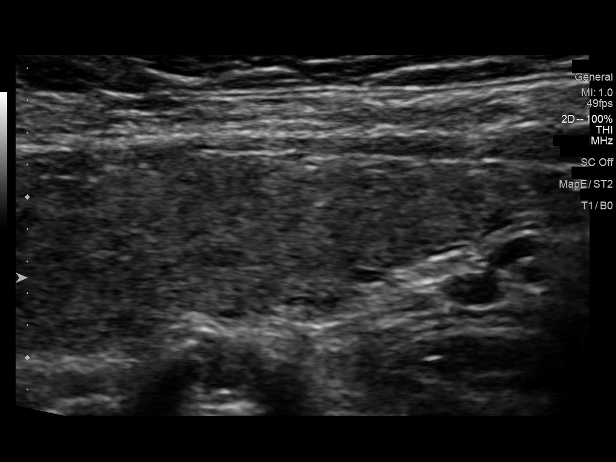
[im 41/49]
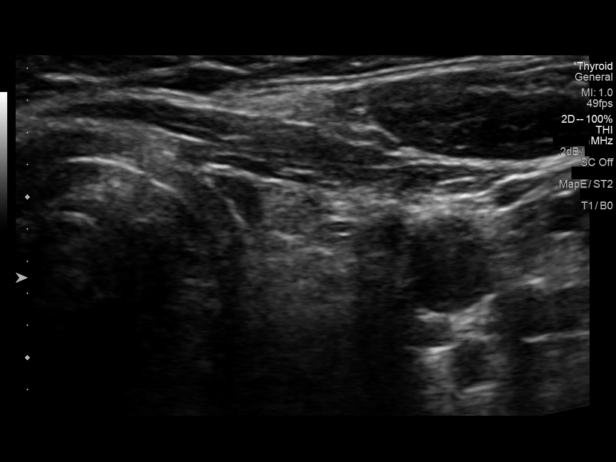
[im 45/49]
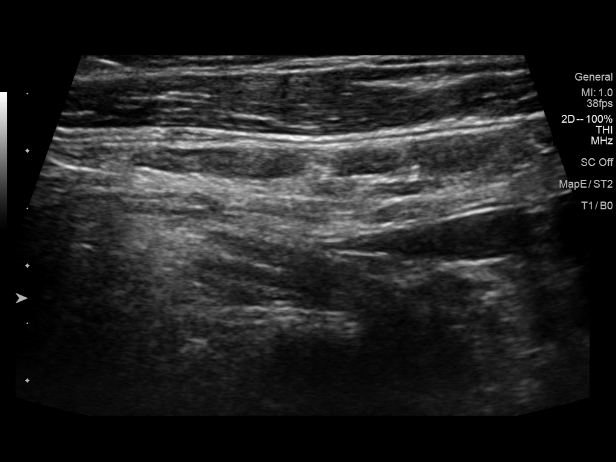
[im 49/49]
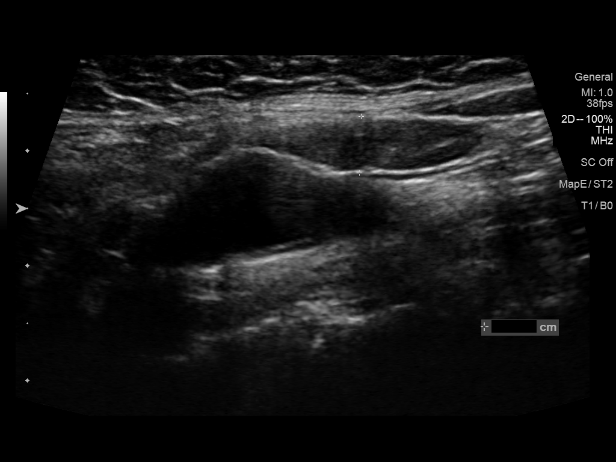

[14 of 25 positions shown; findings below may reference images not displayed]

FINDINGS: Parenchymal Echotexture: Moderately heterogenous

Isthmus: 0.2 cm

Right lobe: 5.2 x 1.5 x 1.7 cm

Left lobe: 3.7 x 1.1 x 1.4 cm

_________________________________________________________

Estimated total number of nodules >/= 1 cm: 3

Number of spongiform nodules >/=  2 cm not described below (TR1): 0

Number of mixed cystic and solid nodules >/= 1.5 cm not described
below (TR2): 0

_________________________________________________________

Nodule # 1:

Prior biopsy: No

Location: Right; Mid

Maximum size: 2.2 cm; Other 2 dimensions: 1.5 x 0.8 cm, previously,
2.1 x 1.5 x 1.1 cm

Composition: solid/almost completely solid (2)

Echogenicity: isoechoic (1)

Shape: not taller-than-wide (0)

Margins: smooth (0)

Echogenic foci: none (0)

ACR TI-RADS total points: 3.

ACR TI-RADS risk category:  TR3 (3 points).

Significant change in size (>/= 20% in two dimensions and minimal
increase of 2 mm): No

Change in features: No

Change in ACR TI-RADS risk category: No

ACR TI-RADS recommendations:

*Given size (>/= 1.5 - 2.4 cm) and appearance, a follow-up
ultrasound in 1 year should be considered based on TI-RADS criteria.

_________________________________________________________

Nodule # 2:

Prior biopsy: No

Location: Right; Mid

Maximum size: 1.3 cm; Other 2 dimensions: 0.7 x 0.7 cm, previously,
1.3 x 0.7 x 0.6 cm

Composition: solid/almost completely solid (2)

Echogenicity: isoechoic (1)

Shape: not taller-than-wide (0)

Margins: ill-defined (0)

Echogenic foci: none (0)

ACR TI-RADS total points: 3.

ACR TI-RADS risk category:  TR3 (3 points).

Significant change in size (>/= 20% in two dimensions and minimal
increase of 2 mm): No

Change in features: No

Change in ACR TI-RADS risk category: No

ACR TI-RADS recommendations:

Given size (<1.4 cm) and appearance, this nodule does NOT meet
TI-RADS criteria for biopsy or dedicated follow-up.

_________________________________________________________

Nodule # 4:

Prior biopsy: No

Location: Right; Inferior

Maximum size: 1.8 cm; Other 2 dimensions: 1.6 x 1.1 cm, previously,
2.0 x 1.5 x 1.2 cm

Composition: solid/almost completely solid (2)

Echogenicity: isoechoic (1)

Shape: not taller-than-wide (0)

Margins: ill-defined (0)

Echogenic foci: none (0)

ACR TI-RADS total points: 3.

ACR TI-RADS risk category:  TR3 (3 points).

Significant change in size (>/= 20% in two dimensions and minimal
increase of 2 mm): No

Change in features: No

Change in ACR TI-RADS risk category: No

ACR TI-RADS recommendations:

*Given size (>/= 1.5 - 2.4 cm) and appearance, a follow-up
ultrasound in 1 year should be considered based on TI-RADS criteria.

_________________________________________________________
IMPRESSION: No significant interval change in the size or appearance of
right-sided thyroid nodules dating back to Sunday January, 2018 consistent
with 2 year stability. Recommend continued annual or every other
year follow-up ultrasound until 5 year stability has been confirmed.

The above is in keeping with the ACR TI-RADS recommendations - [HOSPITAL] 4614;[DATE].

ADDENDUM:
Previously questioned mildly prominent right cervical lymph node has
decreased in size in the interval, previously measuring 0.9 cm in
greatest short axis diameter, currently, 0.6 cm and now maintains a
benign fatty hilum (representative image 1).

Note is made of a similar appearing non pathologically enlarged and
benign-appearing left cervical lymph node which is not enlarged by
size criteria measuring 0.5 cm in greatest short axis diameter and
also maintains a benign fatty hilum (image 15).
IMPRESSION: Interval decrease in size of non pathologically enlarged right
cervical lymph node which now maintains a benign fatty hilum and
thus confirmed to be reactive in etiology.

*** End of Addendum ***
FINDINGS: Parenchymal Echotexture: Moderately heterogenous

Isthmus: 0.2 cm

Right lobe: 5.2 x 1.5 x 1.7 cm

Left lobe: 3.7 x 1.1 x 1.4 cm

_________________________________________________________

Estimated total number of nodules >/= 1 cm: 3

Number of spongiform nodules >/=  2 cm not described below (TR1): 0

Number of mixed cystic and solid nodules >/= 1.5 cm not described
below (TR2): 0

_________________________________________________________

Nodule # 1:

Prior biopsy: No

Location: Right; Mid

Maximum size: 2.2 cm; Other 2 dimensions: 1.5 x 0.8 cm, previously,
2.1 x 1.5 x 1.1 cm

Composition: solid/almost completely solid (2)

Echogenicity: isoechoic (1)

Shape: not taller-than-wide (0)

Margins: smooth (0)

Echogenic foci: none (0)

ACR TI-RADS total points: 3.

ACR TI-RADS risk category:  TR3 (3 points).

Significant change in size (>/= 20% in two dimensions and minimal
increase of 2 mm): No

Change in features: No

Change in ACR TI-RADS risk category: No

ACR TI-RADS recommendations:

*Given size (>/= 1.5 - 2.4 cm) and appearance, a follow-up
ultrasound in 1 year should be considered based on TI-RADS criteria.

_________________________________________________________

Nodule # 2:

Prior biopsy: No

Location: Right; Mid

Maximum size: 1.3 cm; Other 2 dimensions: 0.7 x 0.7 cm, previously,
1.3 x 0.7 x 0.6 cm

Composition: solid/almost completely solid (2)

Echogenicity: isoechoic (1)

Shape: not taller-than-wide (0)

Margins: ill-defined (0)

Echogenic foci: none (0)

ACR TI-RADS total points: 3.

ACR TI-RADS risk category:  TR3 (3 points).

Significant change in size (>/= 20% in two dimensions and minimal
increase of 2 mm): No

Change in features: No

Change in ACR TI-RADS risk category: No

ACR TI-RADS recommendations:

Given size (<1.4 cm) and appearance, this nodule does NOT meet
TI-RADS criteria for biopsy or dedicated follow-up.

_________________________________________________________

Nodule # 4:

Prior biopsy: No

Location: Right; Inferior

Maximum size: 1.8 cm; Other 2 dimensions: 1.6 x 1.1 cm, previously,
2.0 x 1.5 x 1.2 cm

Composition: solid/almost completely solid (2)

Echogenicity: isoechoic (1)

Shape: not taller-than-wide (0)

Margins: ill-defined (0)

Echogenic foci: none (0)

ACR TI-RADS total points: 3.

ACR TI-RADS risk category:  TR3 (3 points).

Significant change in size (>/= 20% in two dimensions and minimal
increase of 2 mm): No

Change in features: No

Change in ACR TI-RADS risk category: No

ACR TI-RADS recommendations:

*Given size (>/= 1.5 - 2.4 cm) and appearance, a follow-up
ultrasound in 1 year should be considered based on TI-RADS criteria.

_________________________________________________________
IMPRESSION: No significant interval change in the size or appearance of
right-sided thyroid nodules dating back to Sunday January, 2018 consistent
with 2 year stability. Recommend continued annual or every other
year follow-up ultrasound until 5 year stability has been confirmed.

The above is in keeping with the ACR TI-RADS recommendations - [HOSPITAL] 4614;[DATE].

## 2020-07-16 DIAGNOSIS — H00024 Hordeolum internum left upper eyelid: Secondary | ICD-10-CM | POA: Diagnosis not present

## 2020-10-21 ENCOUNTER — Ambulatory Visit: Payer: BC Managed Care – PPO | Admitting: Certified Nurse Midwife

## 2020-10-28 ENCOUNTER — Other Ambulatory Visit: Payer: Self-pay

## 2020-10-28 ENCOUNTER — Ambulatory Visit: Payer: BC Managed Care – PPO | Admitting: Obstetrics and Gynecology

## 2020-10-28 ENCOUNTER — Encounter: Payer: Self-pay | Admitting: Obstetrics and Gynecology

## 2020-10-28 VITALS — BP 136/70 | HR 77 | Ht 62.25 in | Wt 152.4 lb

## 2020-10-28 DIAGNOSIS — Z01419 Encounter for gynecological examination (general) (routine) without abnormal findings: Secondary | ICD-10-CM | POA: Diagnosis not present

## 2020-10-28 DIAGNOSIS — Z833 Family history of diabetes mellitus: Secondary | ICD-10-CM | POA: Diagnosis not present

## 2020-10-28 DIAGNOSIS — Z0000 Encounter for general adult medical examination without abnormal findings: Secondary | ICD-10-CM

## 2020-10-28 NOTE — Progress Notes (Signed)
59 y.o. [id number] Married White or Caucasian Not Hispanic or Latino female here for annual exam.  No vaginal bleeding. Not sexually active. No bowel or bladder issues.     Thyroid nodules and cervical lymph node being followed by Surgeon. Normal TFT's.   Patient's last menstrual period was 08/30/2014 (exact date).          Sexually active: No.  The current method of family planning is post menopausal status.    Exercising: No.  The patient does not participate in regular exercise at present. Smoker:  no  Health Maintenance: Pap: 10/17/19 WNL, negative hpv; 02-15-18 ASCUS HPV HR neg,  12-01-15 neg History of abnormal Pap:  no MMG:  12/11/19 density B Bi-rads 1 neg  BMD:  None  Colonoscopy:  2019 f/u 5 years  TDaP:  12/19/13 Gardasil: NA   reports that she quit smoking about 29 years ago. Her smoking use included cigarettes. She has a 3.00 pack-year smoking history. She has never used smokeless tobacco. She reports current alcohol use. She reports that she does not use drugs. Rare ETOH. 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 Daughter in [place], [place]. 2 grandsons, 8 and 4.5.   Past Medical History:  Diagnosis Date  . Multiple thyroid nodules    followed by pcp-- per pt ultrasound done 02-24-2018 in epic  . PMB (postmenopausal bleeding)   . Wears contact lenses     Past Surgical History:  Procedure Laterality Date  . CARPAL TUNNEL RELEASE Right 2010  . CARPAL TUNNEL RELEASE Left 02/08/2019   Procedure: CARPAL TUNNEL RELEASE LEFT;  Surgeon: Donato Heinz, MD;  Location: [facility];  Service: Orthopedics;  Laterality: Left;  . DILATATION & CURETTAGE/HYSTEROSCOPY WITH MYOSURE N/A 03/29/2018   Procedure: DILATATION & CURETTAGE/HYSTEROSCOPY WITH MYOSURE;  Surgeon: Romualdo Bolk, MD;  Location: [facility];  Service: Gynecology;  Laterality: N/A;  . HYSTEROSCOPY      Current Outpatient Medications  Medication Sig Dispense Refill  . cholecalciferol (VITAMIN D3) 25  MCG (1000 UT) tablet Take 1,000 Units by mouth daily.    Marland Kitchen ELDERBERRY PO Take 15 mLs by mouth daily.    Marland Kitchen ketotifen (ALAWAY) 0.025 % ophthalmic solution Place 1 drop into the right eye daily as needed (allergies).    . loratadine (CLARITIN) 10 MG tablet Take 10 mg by mouth daily as needed for allergies.    . Multiple Vitamin (MULTIVITAMIN) tablet Take 1 tablet by mouth daily.    . naproxen sodium (ANAPROX) 220 MG tablet Take 440 mg by mouth daily.      No current facility-administered medications for this visit.    Family History  Problem Relation Age of Onset  . Hypertension Mother   . Thyroid disease Mother   . Diabetes Father   . Cancer Father 7       Dec metastatic Lung CA 2015prostate, Lung dx 06/26/14  . Hypertension Father   . Heart disease Father     Review of Systems  All other systems reviewed and are negative.   Exam:   BP 136/70   Pulse 77   Ht 5' 2.25" (1.581 m)   Wt 152 lb 6.4 oz (69.1 kg)   LMP 08/30/2014 (Exact Date)   SpO2 99%   BMI 27.65 kg/m   Weight change: @WEIGHTCHANGE @ Height:   Height: 5' 2.25" (158.1 cm)  Ht Readings from Last 3 Encounters:  10/28/20 5' 2.25" (1.581 m)  10/17/19 5' 2.25" (1.581 m)  02/06/19 5\' 2"  (  1.575 m)    General appearance: alert, cooperative and appears stated age Head: Normocephalic, without obvious abnormality, atraumatic Neck: no adenopathy, supple, symmetrical, trachea midline and thyroid right lobe>left Lungs: clear to auscultation bilaterally Cardiovascular: regular rate and rhythm Breasts: normal appearance, no masses or tenderness Abdomen: soft, non-tender; non distended,  no masses,  no organomegaly Extremities: extremities normal, atraumatic, no cyanosis or edema Skin: Skin color, texture, turgor normal. No rashes or lesions Lymph nodes: Cervical, supraclavicular, and axillary nodes normal. No abnormal inguinal nodes palpated Neurologic: Grossly normal   Pelvic: External genitalia:  no lesions               Urethra:  normal appearing urethra with no masses, tenderness or lesions              Bartholins and Skenes: normal                 Vagina: normal appearing vagina with normal color and discharge, no lesions              Cervix: no lesions               Bimanual Exam:  Uterus:  normal size, contour, position, consistency, mobility, non-tender              Adnexa: no mass, fullness, tenderness               Rectovaginal: Confirms               Anus:  normal sphincter tone, no lesions  Shanon Petty chaperoned for the exam.  A:  Well Woman with normal exam  Thyroid nodules, cervical lymph node being followed by Surgery.   FH of DM  P:   No pap this year  Mammogram due in 1/22  Colonoscopy UTD  Discussed breast self exam  Discussed calcium and vit D intake  Screening labs, [id number]

## 2020-10-28 NOTE — Patient Instructions (Signed)
EXERCISE   We recommended that you start or continue a regular exercise program for good health. Physical activity is anything that gets your body moving, some is better than none. The CDC recommends 150 minutes per week of Moderate-Intensity Aerobic Activity and 2 or more days of Muscle Strengthening Activity.  Benefits of exercise are limitless: helps weight loss/weight maintenance, improves mood and energy, helps with depression and anxiety, improves sleep, tones and strengthens muscles, improves balance, improves bone density, protects from chronic conditions such as heart disease, high blood pressure and diabetes and so much more. To learn more visit: https://www.cdc.gov/physicalactivity/index.html  DIET: Good nutrition starts with a healthy diet of fruits, vegetables, whole grains, and lean protein sources. Drink plenty of water for hydration. Minimize empty calories, sodium, sweets. For more information about dietary recommendations visit: https://health.gov/our-work/nutrition-physical-activity/dietary-guidelines and https://www.myplate.gov/  ALCOHOL:  Women should limit their alcohol intake to no more than 7 drinks/beers/glasses of wine (combined, not each!) per week. Moderation of alcohol intake to this level decreases your risk of breast cancer and liver damage.  If you are concerned that you may have a problem, or your friends have told you they are concerned about your drinking, there are many resources to help. A well-known program that is free, effective, and available to all people all over the nation is Alcoholics Anonymous.  Check out this site to learn more: https://www.aa.org/   CALCIUM AND VITAMIN D:  Adequate intake of calcium and Vitamin D are recommended for bone health.  The recommendations for exact amounts of these supplements seem to change often, but generally speaking 1000-1500 mg of calcium (between diet and supplement) and 800 units of Vitamin D per day seems prudent.      PAP SMEARS:  Pap smears, to check for cervical cancer or precancers,  have traditionally been done yearly, although recent scientific advances have shown that most women can have pap smears less often.  However, every woman still should have a physical exam from her gynecologist every year. It will include a breast check, inspection of the vulva and vagina to check for abnormal growths or skin changes, a visual exam of the cervix, and then an exam to evaluate the size and shape of the uterus and ovaries.  And after 57 years of age, a rectal exam is indicated to check for rectal cancers. We will also provide age appropriate advice regarding health maintenance, like when you should have certain vaccines, screening for sexually transmitted diseases, bone density testing, colonoscopy, mammograms, etc.   MAMMOGRAMS:  All women over 40 years old should have a routine mammogram.   COLON CANCER SCREENING: Now recommend starting at age 45. At this time colonoscopy is not covered for routine screening until 50. There are take home tests that can be done between 45-49.   COLONOSCOPY:  Colonoscopy to screen for colon cancer is recommended for all women at age 50.  We know, you hate the idea of the prep.  We agree, BUT, having colon cancer and not knowing it is worse!!  Colon cancer so often starts as a polyp that can be seen and removed at colonscopy, which can quite literally save your life!  And if your first colonoscopy is normal and you have no family history of colon cancer, most women don't have to have it again for 10 years.  Once every ten years, you can do something that may end up saving your life, right?  We will be happy to help you get it scheduled when   you are ready.  Be sure to check your insurance coverage so you understand how much it will cost.  It may be covered as a preventative service at no cost, but you should check your particular policy.      Breast Self-Awareness Breast self-awareness  means being familiar with how your breasts look and feel. It involves checking your breasts regularly and reporting any changes to your health care provider. Practicing breast self-awareness is important. A change in your breasts can be a sign of a serious medical problem. Being familiar with how your breasts look and feel allows you to find any problems early, when treatment is more likely to be successful. All women should practice breast self-awareness, including women who have had breast implants. How to do a breast self-exam One way to learn what is normal for your breasts and whether your breasts are changing is to do a breast self-exam. To do a breast self-exam: Look for Changes  1. Remove all the clothing above your waist. 2. Stand in front of a mirror in a room with good lighting. 3. Put your hands on your hips. 4. Push your hands firmly downward. 5. Compare your breasts in the mirror. Look for differences between them (asymmetry), such as: ? Differences in shape. ? Differences in size. ? Puckers, dips, and bumps in one breast and not the other. 6. Look at each breast for changes in your skin, such as: ? Redness. ? Scaly areas. 7. Look for changes in your nipples, such as: ? Discharge. ? Bleeding. ? Dimpling. ? Redness. ? A change in position. Feel for Changes Carefully feel your breasts for lumps and changes. It is best to do this while lying on your back on the floor and again while sitting or standing in the shower or tub with soapy water on your skin. Feel each breast in the following way:  Place the arm on the side of the breast you are examining above your head.  Feel your breast with the other hand.  Start in the nipple area and make  inch (2 cm) overlapping circles to feel your breast. Use the pads of your three middle fingers to do this. Apply light pressure, then medium pressure, then firm pressure. The light pressure will allow you to feel the tissue closest to the  skin. The medium pressure will allow you to feel the tissue that is a little deeper. The firm pressure will allow you to feel the tissue close to the ribs.  Continue the overlapping circles, moving downward over the breast until you feel your ribs below your breast.  Move one finger-width toward the center of the body. Continue to use the  inch (2 cm) overlapping circles to feel your breast as you move slowly up toward your collarbone.  Continue the up and down exam using all three pressures until you reach your armpit.  Write Down What You Find  Write down what is normal for each breast and any changes that you find. Keep a written record with breast changes or normal findings for each breast. By writing this information down, you do not need to depend only on memory for size, tenderness, or location. Write down where you are in your menstrual cycle, if you are still menstruating. If you are having trouble noticing differences in your breasts, do not get discouraged. With time you will become more familiar with the variations in your breasts and more comfortable with the exam. How often should I examine   my breasts? Examine your breasts every month. If you are breastfeeding, the best time to examine your breasts is after a feeding or after using a breast pump. If you menstruate, the best time to examine your breasts is 5-7 days after your period is over. During your period, your breasts are lumpier, and it may be more difficult to notice changes. When should I see my health care provider? See your health care provider if you notice:  A change in shape or size of your breasts or nipples.  A change in the skin of your breast or nipples, such as a reddened or scaly area.  Unusual discharge from your nipples.  A lump or thick area that was not there before.  Pain in your breasts.  Anything that concerns you.  

## 2020-10-29 LAB — CBC
Hematocrit: 39.3 % (ref 34.0–46.6)
Hemoglobin: 13.6 g/dL (ref 11.1–15.9)
MCH: 31 pg (ref 26.6–33.0)
MCHC: 34.6 g/dL (ref 31.5–35.7)
MCV: 90 fL (ref 79–97)
Platelets: 249 10*3/uL (ref 150–450)
RBC: 4.39 x10E6/uL (ref 3.77–5.28)
RDW: 11.9 % (ref 11.7–15.4)
WBC: 10.1 10*3/uL (ref 3.4–10.8)

## 2020-10-29 LAB — COMPREHENSIVE METABOLIC PANEL
ALT: 14 IU/L (ref 0–32)
AST: 19 IU/L (ref 0–40)
Albumin/Globulin Ratio: 1.9 (ref 1.2–2.2)
Albumin: 4.5 g/dL (ref 3.8–4.9)
Alkaline Phosphatase: 101 IU/L (ref 44–121)
BUN/Creatinine Ratio: 13 (ref 9–23)
BUN: 11 mg/dL (ref 6–24)
Bilirubin Total: 0.3 mg/dL (ref 0.0–1.2)
CO2: 24 mmol/L (ref 20–29)
Calcium: 9.6 mg/dL (ref 8.7–10.2)
Chloride: 104 mmol/L (ref 96–106)
Creatinine, Ser: 0.87 mg/dL (ref 0.57–1.00)
GFR calc Af Amer: 86 mL/min/{1.73_m2} (ref >59)
GFR calc non Af Amer: 74 mL/min/{1.73_m2} (ref >59)
Globulin, Total: 2.4 g/dL (ref 1.5–4.5)
Glucose: 94 mg/dL (ref 65–99)
Potassium: 3.9 mmol/L (ref 3.5–5.2)
Sodium: 141 mmol/L (ref 134–144)
Total Protein: 6.9 g/dL (ref 6.0–8.5)

## 2020-10-29 LAB — LIPID PANEL
Chol/HDL Ratio: 3.6 ratio (ref 0.0–4.4)
Cholesterol, Total: 208 mg/dL — ABNORMAL HIGH (ref 100–199)
HDL: 58 mg/dL (ref >39)
LDL Chol Calc (NIH): 133 mg/dL — ABNORMAL HIGH (ref 0–99)
Triglycerides: 96 mg/dL (ref 0–149)
VLDL Cholesterol Cal: 17 mg/dL (ref 5–40)

## 2020-10-29 LAB — HEMOGLOBIN A1C
Est. average glucose Bld gHb Est-mCnc: 105 mg/dL
Hgb A1c MFr Bld: 5.3 % (ref 4.8–5.6)

## 2021-01-03 DIAGNOSIS — Z1231 Encounter for screening mammogram for malignant neoplasm of breast: Secondary | ICD-10-CM | POA: Diagnosis not present

## 2021-02-03 DIAGNOSIS — R928 Other abnormal and inconclusive findings on diagnostic imaging of breast: Secondary | ICD-10-CM | POA: Diagnosis not present

## 2021-04-24 DIAGNOSIS — E042 Nontoxic multinodular goiter: Secondary | ICD-10-CM | POA: Diagnosis not present

## 2021-04-24 DIAGNOSIS — E039 Hypothyroidism, unspecified: Secondary | ICD-10-CM | POA: Diagnosis not present

## 2021-04-25 DIAGNOSIS — E042 Nontoxic multinodular goiter: Secondary | ICD-10-CM | POA: Diagnosis not present

## 2021-05-06 ENCOUNTER — Other Ambulatory Visit: Payer: Self-pay | Admitting: Surgery

## 2021-05-06 DIAGNOSIS — E042 Nontoxic multinodular goiter: Secondary | ICD-10-CM

## 2021-05-21 ENCOUNTER — Ambulatory Visit
Admission: RE | Admit: 2021-05-21 | Discharge: 2021-05-21 | Disposition: A | Discharge status: Home / Self Care | Payer: BC Managed Care – PPO | Source: Ambulatory Visit | Attending: Surgery | Admitting: Surgery

## 2021-05-21 DIAGNOSIS — E042 Nontoxic multinodular goiter: Secondary | ICD-10-CM

## 2021-05-21 DIAGNOSIS — E041 Nontoxic single thyroid nodule: Secondary | ICD-10-CM | POA: Diagnosis not present

## 2021-08-15 IMAGING — US US THYROID
1 series · 13 of 25 positions shown · non-contrast
Comparison: Multiple prior most recent 04/15/2020

CLINICAL DATA: 59-year-old female with a history of thyroid nodules

EXAM:
THYROID ULTRASOUND
TECHNIQUE: Ultrasound examination of the thyroid gland and adjacent soft
tissues was performed.

[Series 1: us thyroid · 0.05mm/px · 13 of 40 slices shown]
[im 1/40]
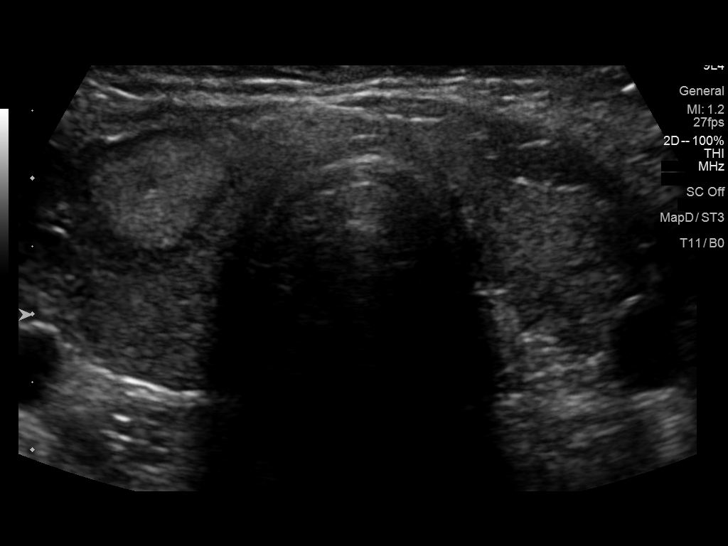
[im 4/40]
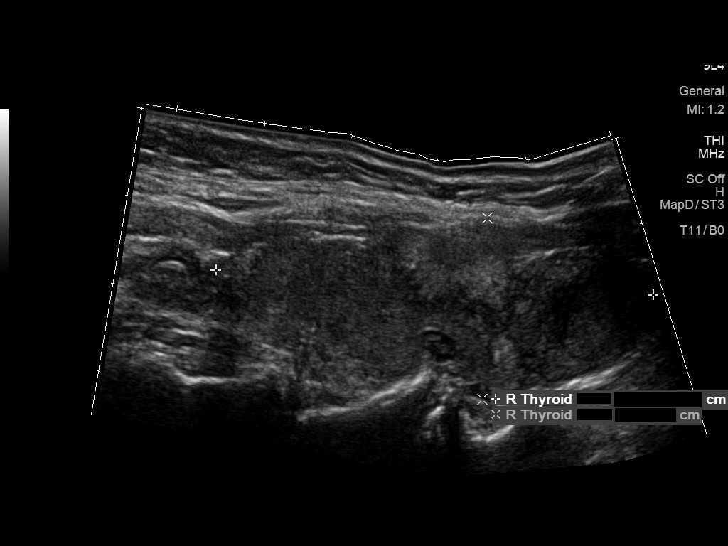
[im 7/40]
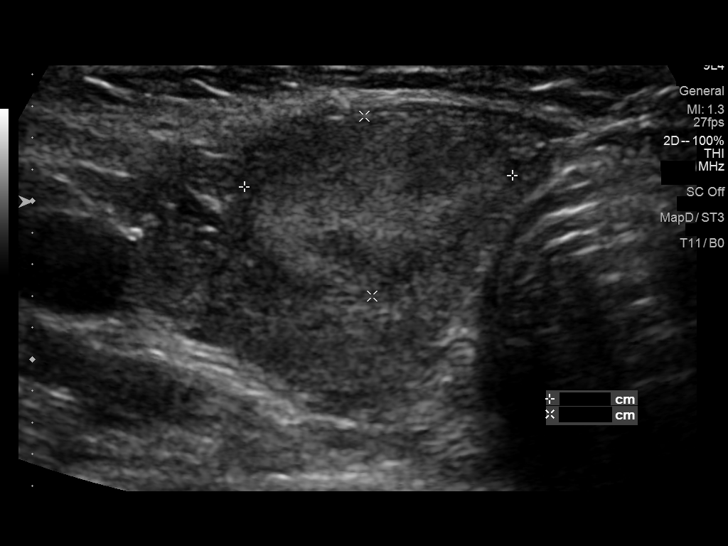
[im 10/40]
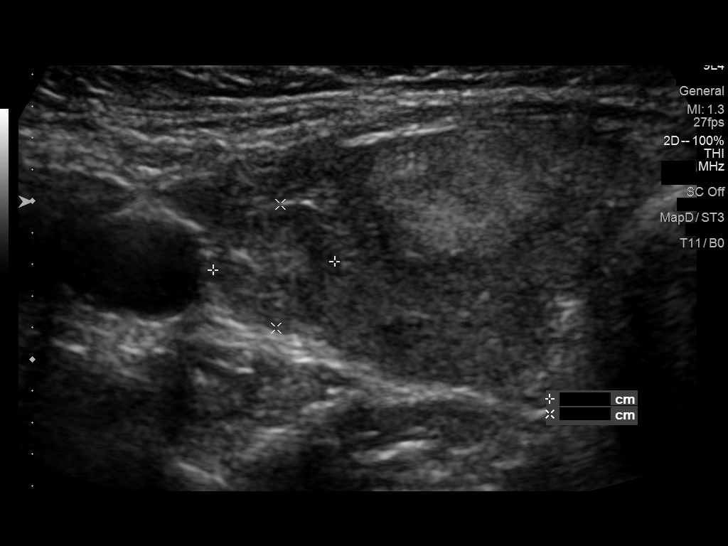
[im 14/40]
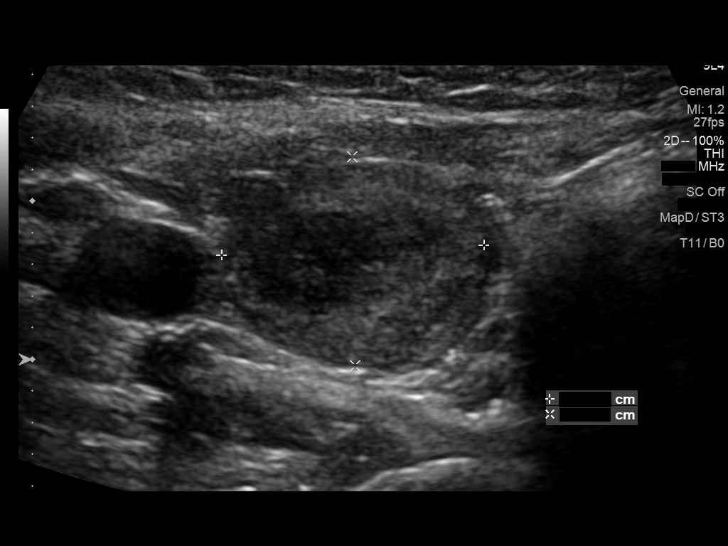
[im 17/40]
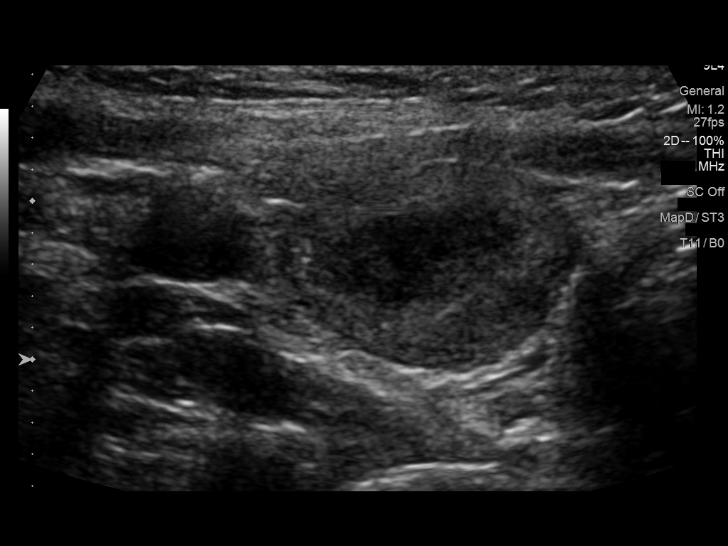
[im 20/40]
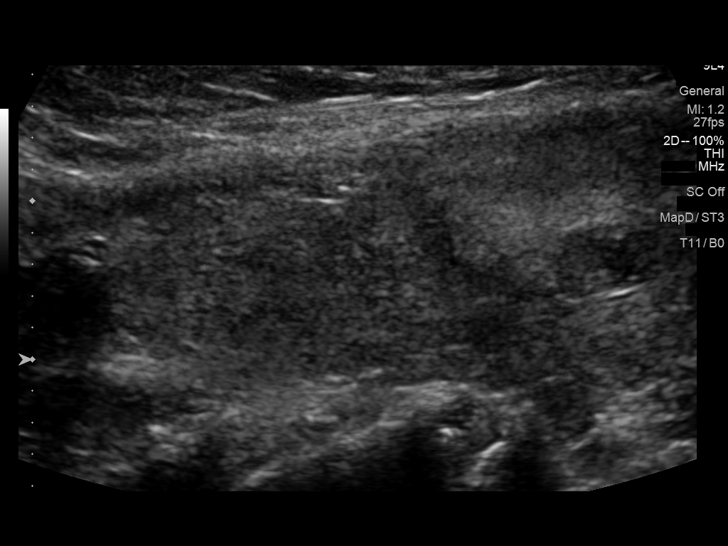
[im 23/40]
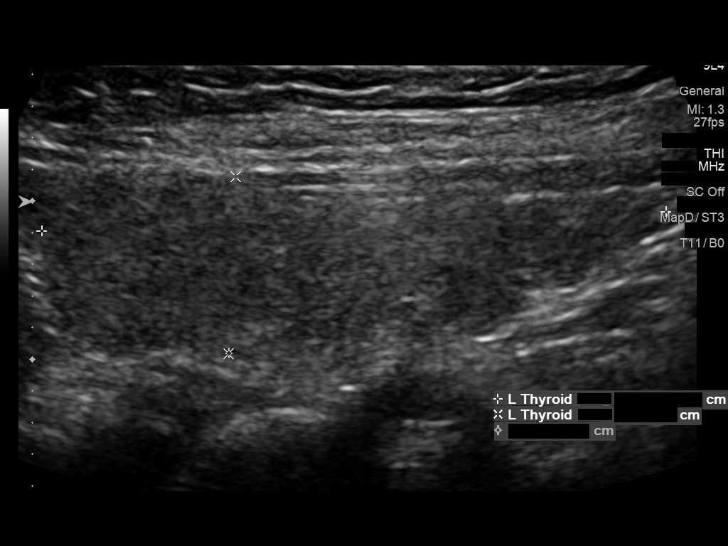
[im 27/40]
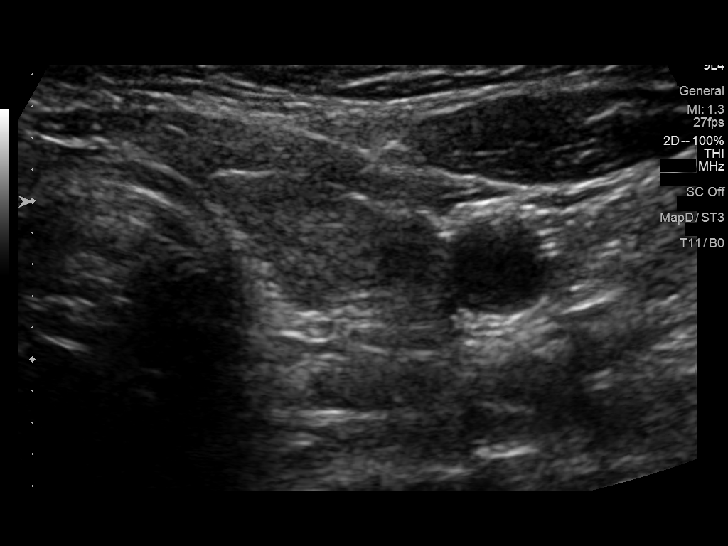
[im 30/40]
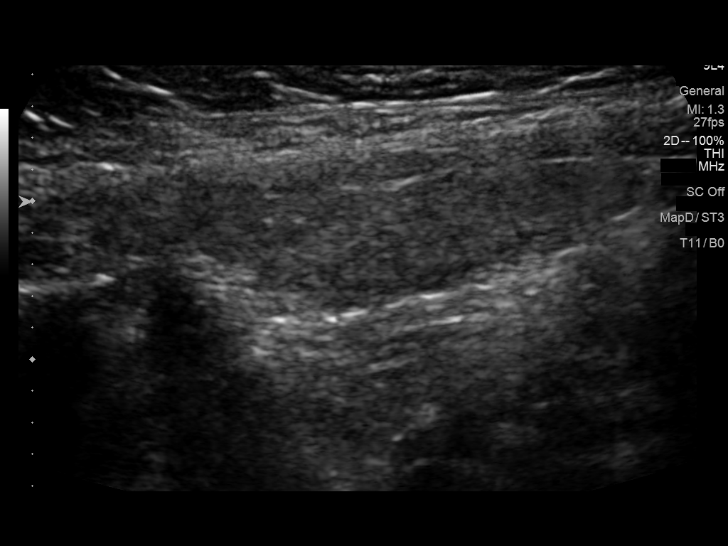
[im 33/40]
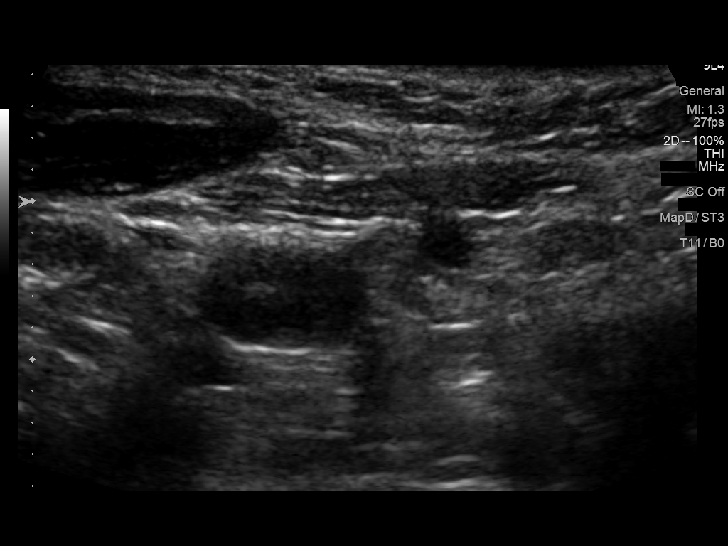
[im 36/40]
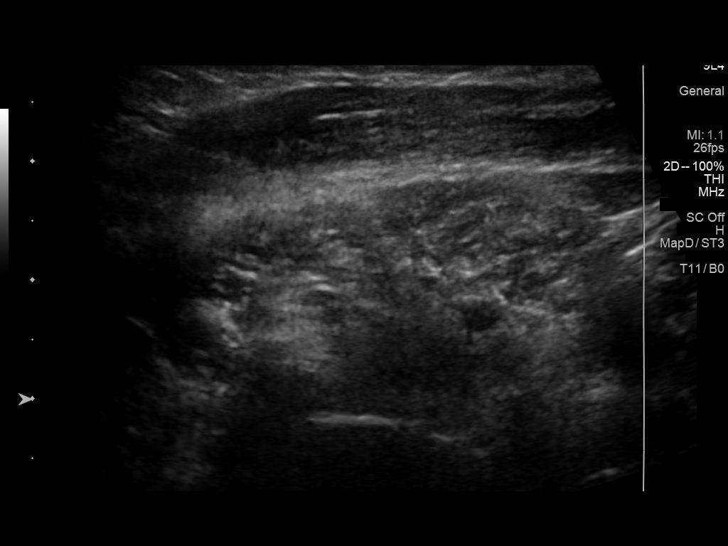
[im 40/40]
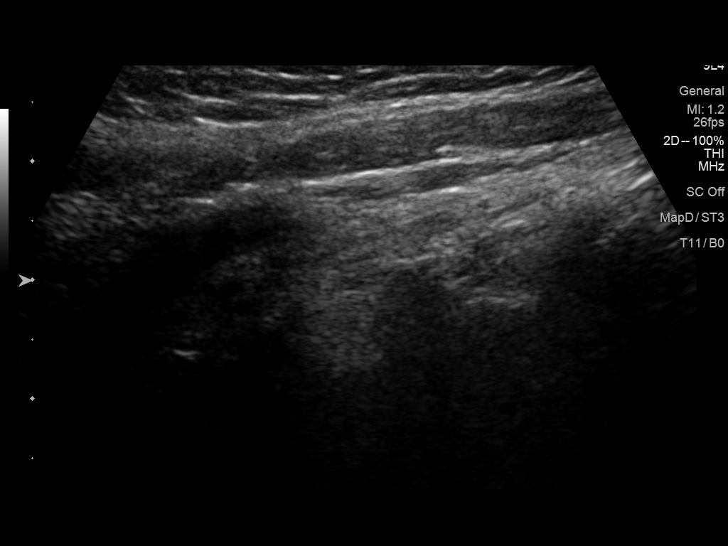

[13 of 25 positions shown; findings below may reference images not displayed]

FINDINGS: Parenchymal Echotexture: Moderately heterogenous

Isthmus: 0.3 cm

Right lobe: 5.0 cm x 2.0 cm x 1.9 cm

Left lobe: 4.0 cm x 1.1 cm x 1.8 cm

_________________________________________________________

Estimated total number of nodules >/= 1 cm: 3

Number of spongiform nodules >/=  2 cm not described below (TR1): 0

Number of mixed cystic and solid nodules >/= 1.5 cm not described
below (TR2): 0

_________________________________________________________

Nodule labeled 1 in the mid right thyroid, 2.2 cm, unchanged. This
remains TR 3 and meets criteria for surveillance.

Nodule labeled 2 in the mid right thyroid, 1.4 cm. Nodule remains TR
3 and does not meet criteria for surveillance or biopsy.

Nodule labeled 3 inferior right thyroid, 2.0 cm, previously 1.8 cm.
Nodule remains TR 3 and meets criteria for continued surveillance.

No adenopathy.
IMPRESSION: Multinodular thyroid again demonstrated.

Right mid thyroid nodule (labeled 1, 2.2 cm, TR 3) and the right
inferior thyroid nodule (labeled 3, 2.0 cm, TR 3) both meet criteria
for continued surveillance, as designated by the newly established
ACR TI-RADS criteria. Surveillance ultrasound study recommended to
be performed annually up to 5 years.

Recommendations follow those established by the new ACR TI-RADS
criteria ([HOSPITAL] 7377;[DATE]).

## 2021-10-29 NOTE — Progress Notes (Addendum)
60 y.o. G73P1001 Married White or Caucasian Not Hispanic or Latino female here for annual exam.  No vaginal bleeding. Not sexually active.   No bowel or bladder c/o.   Has a rash on her face.    Thyroid nodules and cervical lymph node being followed by Surgeon. Normal TFT's. Stable u/s. F/U in 5/23.   Patient's last menstrual period was 08/30/2014 (exact date).          Sexually active: No.  The current method of family planning is post menopausal status.    Exercising: No.  The patient does not participate in regular exercise at present. Smoker:  no  Health Maintenance: Pap:  10/17/19 WNL, negative hpv; 02-15-18 ASCUS HPV HR neg,  12-01-15 neg History of abnormal Pap:  no MMG:  01/03/21 incomplete, 02/03/21 tomo Bi-rads 1 neg  BMD:   none  Colonoscopy: 2019 f/u 5 years  TDaP:  12/19/13 Gardasil: NA   reports that she quit smoking about 30 years ago. Her smoking use included cigarettes. She has a 3.00 pack-year smoking history. She has never used smokeless tobacco. She reports current alcohol use. She reports that she does not use drugs. Rare ETOH. 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 Daughter in [place], [place]. 2 grandsons, 9 and 5.5.   Past Medical History:  Diagnosis Date   Multiple thyroid nodules    followed by pcp-- per pt ultrasound done 02-24-2018 in epic   PMB (postmenopausal bleeding)    Wears contact lenses     Past Surgical History:  Procedure Laterality Date   CARPAL TUNNEL RELEASE Right 2010   CARPAL TUNNEL RELEASE Left 02/08/2019   Procedure: CARPAL TUNNEL RELEASE LEFT;  Surgeon: Donato Heinz, MD;  Location: [facility];  Service: Orthopedics;  Laterality: Left;   DILATATION & CURETTAGE/HYSTEROSCOPY WITH MYOSURE N/A 03/29/2018   Procedure: DILATATION & CURETTAGE/HYSTEROSCOPY WITH MYOSURE;  Surgeon: Romualdo Bolk, MD;  Location: [facility] [place];  Service: Gynecology;  Laterality: N/A;   HYSTEROSCOPY      Current Outpatient Medications   Medication Sig Dispense Refill   cholecalciferol (VITAMIN D3) 25 MCG (1000 UT) tablet Take 1,000 Units by mouth daily.     ELDERBERRY PO Take 15 mLs by mouth daily.     ketotifen (ZADITOR) 0.025 % ophthalmic solution Place 1 drop into the right eye daily as needed (allergies).     loratadine (CLARITIN) 10 MG tablet Take 10 mg by mouth daily as needed for allergies.     Multiple Vitamin (MULTIVITAMIN) tablet Take 1 tablet by mouth daily.     naproxen sodium (ANAPROX) 220 MG tablet Take 440 mg by mouth daily.      No current facility-administered medications for this visit.    Family History  Problem Relation Age of Onset   Hypertension Mother    Thyroid disease Mother    Diabetes Father    Cancer Father 31       Dec metastatic Lung CA 2015prostate, Lung dx 06/26/14   Hypertension Father    Heart disease Father     Review of Systems  All other systems reviewed and are negative.  Exam:   BP 130/72   Pulse 88   Ht 5\' 2"  (1.575 m)   Wt 150 lb 3.2 oz (68.1 kg)   LMP 08/30/2014 (Exact Date)   SpO2 99%   BMI 27.47 kg/m   Weight change: @WEIGHTCHANGE @ Height:   Height: 5\' 2"  (157.5 cm)  Ht Readings from Last 3 Encounters:  11/03/21 5\' 2"  (1.575 m)  10/28/20 5' 2.25" (1.581 m)  10/17/19 5' 2.25" (1.581 m)    General appearance: alert, cooperative and appears stated age Head: Normocephalic, without obvious abnormality, atraumatic Neck: no adenopathy, supple, symmetrical, trachea midline and thyroid  enlarged on the right Lungs: clear to auscultation bilaterally Cardiovascular: regular rate and rhythm Breasts: normal appearance, no masses or tenderness Abdomen: soft, non-tender; non distended,  no masses,  no organomegaly Extremities: extremities normal, atraumatic, no cyanosis or edema Skin: Skin color, texture, turgor normal. No rashes or lesions Lymph nodes: Cervical, supraclavicular, and axillary nodes normal. No abnormal inguinal nodes palpated Neurologic: Grossly  normal   Pelvic: External genitalia:  no lesions              Urethra:  normal appearing urethra with no masses, tenderness or lesions              Bartholins and Skenes: normal                 Vagina: normal appearing vagina with normal color and discharge, no lesions              Cervix: no lesions               Bimanual Exam:  Uterus:  normal size, contour, position, consistency, mobility, non-tender              Adnexa: no mass, fullness, tenderness               Rectovaginal: Confirms               Anus:  normal sphincter tone, no lesions  10/19/19 chaperoned for the exam.      1. Well woman exam Discussed breast self exam Discussed calcium and vit D intake Mammogram in 2/23 Colonoscopy UTD No pap this year  2. Multiple thyroid nodules Stable, followed by Surgery  3. Family history of diabetes mellitus (DM) - Hemoglobin A1c  4. Laboratory exam ordered as part of routine general medical examination - CBC - Comprehensive metabolic panel - Lipid panel  Addendum: status changed to divorced.

## 2021-11-03 ENCOUNTER — Ambulatory Visit (INDEPENDENT_AMBULATORY_CARE_PROVIDER_SITE_OTHER): Payer: BC Managed Care – PPO | Admitting: Obstetrics and Gynecology

## 2021-11-03 ENCOUNTER — Other Ambulatory Visit: Payer: Self-pay

## 2021-11-03 ENCOUNTER — Encounter: Payer: Self-pay | Admitting: Obstetrics and Gynecology

## 2021-11-03 VITALS — BP 130/72 | HR 88 | Ht 62.0 in | Wt 150.2 lb

## 2021-11-03 DIAGNOSIS — Z0000 Encounter for general adult medical examination without abnormal findings: Secondary | ICD-10-CM | POA: Diagnosis not present

## 2021-11-03 DIAGNOSIS — Z01419 Encounter for gynecological examination (general) (routine) without abnormal findings: Secondary | ICD-10-CM | POA: Diagnosis not present

## 2021-11-03 DIAGNOSIS — Z833 Family history of diabetes mellitus: Secondary | ICD-10-CM

## 2021-11-03 DIAGNOSIS — E042 Nontoxic multinodular goiter: Secondary | ICD-10-CM | POA: Diagnosis not present

## 2021-11-03 NOTE — Patient Instructions (Signed)

## 2021-11-04 DIAGNOSIS — L03211 Cellulitis of face: Secondary | ICD-10-CM | POA: Diagnosis not present

## 2021-11-04 DIAGNOSIS — L239 Allergic contact dermatitis, unspecified cause: Secondary | ICD-10-CM | POA: Diagnosis not present

## 2021-11-04 LAB — COMPREHENSIVE METABOLIC PANEL
AG Ratio: 1.7 (calc) (ref 1.0–2.5)
ALT: 13 U/L (ref 6–29)
AST: 18 U/L (ref 10–35)
Albumin: 4.5 g/dL (ref 3.6–5.1)
Alkaline phosphatase (APISO): 91 U/L (ref 37–153)
BUN: 11 mg/dL (ref 7–25)
CO2: 28 mmol/L (ref 20–32)
Calcium: 9.7 mg/dL (ref 8.6–10.4)
Chloride: 103 mmol/L (ref 98–110)
Creat: 0.83 mg/dL (ref 0.50–1.03)
Globulin: 2.6 g/dL (calc) (ref 1.9–3.7)
Glucose, Bld: 87 mg/dL (ref 65–99)
Potassium: 4.4 mmol/L (ref 3.5–5.3)
Sodium: 141 mmol/L (ref 135–146)
Total Bilirubin: 0.5 mg/dL (ref 0.2–1.2)
Total Protein: 7.1 g/dL (ref 6.1–8.1)

## 2021-11-04 LAB — CBC
HCT: 40.1 % (ref 35.0–45.0)
Hemoglobin: 13.8 g/dL (ref 11.7–15.5)
MCH: 31 pg (ref 27.0–33.0)
MCHC: 34.4 g/dL (ref 32.0–36.0)
MCV: 90.1 fL (ref 80.0–100.0)
MPV: 10.5 fL (ref 7.5–12.5)
Platelets: 272 10*3/uL (ref 140–400)
RBC: 4.45 10*6/uL (ref 3.80–5.10)
RDW: 12 % (ref 11.0–15.0)
WBC: 9.5 10*3/uL (ref 3.8–10.8)

## 2021-11-04 LAB — LIPID PANEL
Cholesterol: 218 mg/dL — ABNORMAL HIGH (ref <200)
HDL: 54 mg/dL (ref >=50)
LDL Cholesterol (Calc): 139 mg/dL (calc) — ABNORMAL HIGH
Non-HDL Cholesterol (Calc): 164 mg/dL (calc) — ABNORMAL HIGH (ref <130)
Total CHOL/HDL Ratio: 4 (calc) (ref <5.0)
Triglycerides: 124 mg/dL (ref <150)

## 2021-11-04 LAB — HEMOGLOBIN A1C
Hgb A1c MFr Bld: 5.4 % of total Hgb (ref <5.7)
Mean Plasma Glucose: 108 mg/dL
eAG (mmol/L): 6 mmol/L

## 2022-01-05 DIAGNOSIS — Z1231 Encounter for screening mammogram for malignant neoplasm of breast: Secondary | ICD-10-CM | POA: Diagnosis not present

## 2022-01-06 ENCOUNTER — Encounter: Payer: Self-pay | Admitting: Obstetrics and Gynecology

## 2022-10-26 NOTE — Progress Notes (Signed)
61 y.o. G56P1001 Divorced White or Caucasian Not Hispanic or Latino female here for annual exam. No vaginal bleeding. No bowel or bladder issues.     Thyroid nodules and cervical lymph node being followed by Surgeon. Normal TFT's. Stable u/s. She is overdue for f/u there (they were supposed to call her).   Patient's last menstrual period was 08/30/2014 (exact date).          Sexually active: No.  The current method of family planning is post menopausal status.    Exercising: No.  The patient does not participate in regular exercise at present. Smoker:  no  Health Maintenance: Pap:  10/17/19 WNL, negative hpv; 02-15-18 ASCUS HPV HR neg,  12-01-15 neg  History of abnormal Pap:  no MMG:  01/06/22 Bi-rads 1 neg  BMD:   none Colonoscopy:  2019 f/u 5 years  TDaP:  12/19/13 Gardasil: NA   reports that she quit smoking about 31 years ago. Her smoking use included cigarettes. She has a 3.00 pack-year smoking history. She has never used smokeless tobacco. She reports current alcohol use. She reports that she does not use drugs. Rare ETOH. 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 2 grandsons, 10 and 6.    Past Medical History:  Diagnosis Date   Multiple thyroid nodules    followed by pcp-- per pt ultrasound done 02-24-2018 in epic   PMB (postmenopausal bleeding)    Wears contact lenses     Past Surgical History:  Procedure Laterality Date   CARPAL TUNNEL RELEASE Right 2010   CARPAL TUNNEL RELEASE Left 02/08/2019   Procedure: CARPAL TUNNEL RELEASE LEFT;  Surgeon: Donato Heinz, MD;  Location: [facility];  Service: Orthopedics;  Laterality: Left;   DILATATION & CURETTAGE/HYSTEROSCOPY WITH MYOSURE N/A 03/29/2018   Procedure: DILATATION & CURETTAGE/HYSTEROSCOPY WITH MYOSURE;  Surgeon: Romualdo Bolk, MD;  Location: [facility] [place];  Service: Gynecology;  Laterality: N/A;   HYSTEROSCOPY      Current Outpatient Medications  Medication Sig Dispense Refill    cholecalciferol (VITAMIN D3) 25 MCG (1000 UT) tablet Take 1,000 Units by mouth daily.     ELDERBERRY PO Take 15 mLs by mouth daily.     loratadine (CLARITIN) 10 MG tablet Take 10 mg by mouth daily as needed for allergies.     Multiple Vitamin (MULTIVITAMIN) tablet Take 1 tablet by mouth daily.     naproxen sodium (ANAPROX) 220 MG tablet Take 440 mg by mouth daily.      No current facility-administered medications for this visit.    Family History  Problem Relation Age of Onset   Hypertension Mother    Thyroid disease Mother    Diabetes Father    Cancer Father 62       Dec metastatic Lung CA 2015prostate, Lung dx 06/26/14   Hypertension Father    Heart disease Father     Review of Systems  All other systems reviewed and are negative.   Exam:   BP 132/82   Pulse 73   Ht 5\' 2"  (1.575 m)   Wt 154 lb (69.9 kg)   LMP 08/30/2014 (Exact Date)   SpO2 100%   BMI 28.17 kg/m   Weight change: @WEIGHTCHANGE @ Height:   Height: 5\' 2"  (157.5 cm)  Ht Readings from Last 3 Encounters:  11/04/22 5\' 2"  (1.575 m)  11/03/21 5\' 2"  (1.575 m)  10/28/20 5' 2.25" (1.581 m)    General appearance: alert, cooperative and appears stated  age Head: Normocephalic, without obvious abnormality, atraumatic Neck: no adenopathy, supple, symmetrical, trachea midline and thyroid  right lobe>left lobe Lungs: clear to auscultation bilaterally Cardiovascular: regular rate and rhythm Breasts: normal appearance, no masses or tenderness Abdomen: soft, non-tender; non distended,  no masses,  no organomegaly Extremities: extremities normal, atraumatic, no cyanosis or edema Skin: Skin color, texture, turgor normal. No rashes or lesions Lymph nodes: Cervical, supraclavicular, and axillary nodes normal. No abnormal inguinal nodes palpated Neurologic: Grossly normal   Pelvic: External genitalia:  no lesions              Urethra:  normal appearing urethra with no masses, tenderness or lesions               Bartholins and Skenes: normal                 Vagina: normal appearing vagina with normal color and discharge, no lesions              Cervix: no lesions               Bimanual Exam:  Uterus:  normal size, contour, position, consistency, mobility, non-tender              Adnexa: no mass, fullness, tenderness               Rectovaginal: Confirms               Anus:  normal sphincter tone, no lesions  Gae Dry, CMA chaperoned for the exam.  1. Well woman exam Discussed breast self exam Discussed calcium and vit D intake  She will call Dr Collene Mares to schedule her colonosocpy Mammogram in 2/24 No pap this year  2. Multiple thyroid nodules She will f/u with the Surgeon who has been managing this  3. Elevated cholesterol - Lipid panel  4. Family history of diabetes mellitus (DM) - Hemoglobin A1c

## 2022-11-04 ENCOUNTER — Ambulatory Visit (INDEPENDENT_AMBULATORY_CARE_PROVIDER_SITE_OTHER): Payer: BC Managed Care – PPO | Admitting: Obstetrics and Gynecology

## 2022-11-04 ENCOUNTER — Encounter: Payer: Self-pay | Admitting: Obstetrics and Gynecology

## 2022-11-04 VITALS — BP 132/82 | HR 73 | Ht 62.0 in | Wt 154.0 lb

## 2022-11-04 DIAGNOSIS — K5904 Chronic idiopathic constipation: Secondary | ICD-10-CM | POA: Insufficient documentation

## 2022-11-04 DIAGNOSIS — E7800 Pure hypercholesterolemia, unspecified: Secondary | ICD-10-CM | POA: Diagnosis not present

## 2022-11-04 DIAGNOSIS — R195 Other fecal abnormalities: Secondary | ICD-10-CM | POA: Insufficient documentation

## 2022-11-04 DIAGNOSIS — Z833 Family history of diabetes mellitus: Secondary | ICD-10-CM | POA: Diagnosis not present

## 2022-11-04 DIAGNOSIS — Z01419 Encounter for gynecological examination (general) (routine) without abnormal findings: Secondary | ICD-10-CM

## 2022-11-04 DIAGNOSIS — E042 Nontoxic multinodular goiter: Secondary | ICD-10-CM | POA: Diagnosis not present

## 2022-11-04 DIAGNOSIS — Z1211 Encounter for screening for malignant neoplasm of colon: Secondary | ICD-10-CM | POA: Insufficient documentation

## 2022-11-04 NOTE — Patient Instructions (Signed)

## 2022-11-05 LAB — HEMOGLOBIN A1C
Hgb A1c MFr Bld: 5.6 % of total Hgb (ref <5.7)
Mean Plasma Glucose: 114 mg/dL
eAG (mmol/L): 6.3 mmol/L

## 2022-11-05 LAB — LIPID PANEL
Cholesterol: 235 mg/dL — ABNORMAL HIGH (ref <200)
HDL: 61 mg/dL (ref >=50)
LDL Cholesterol (Calc): 151 mg/dL (calc) — ABNORMAL HIGH
Non-HDL Cholesterol (Calc): 174 mg/dL (calc) — ABNORMAL HIGH (ref <130)
Total CHOL/HDL Ratio: 3.9 (calc) (ref <5.0)
Triglycerides: 115 mg/dL (ref <150)

## 2023-03-09 ENCOUNTER — Encounter: Payer: Self-pay | Admitting: Gastroenterology

## 2024-04-13 ENCOUNTER — Other Ambulatory Visit (HOSPITAL_COMMUNITY)
Admission: RE | Admit: 2024-04-13 | Discharge: 2024-04-13 | Disposition: A | Discharge status: Home / Self Care | Source: Ambulatory Visit | Attending: Nurse Practitioner | Admitting: Nurse Practitioner

## 2024-04-13 ENCOUNTER — Ambulatory Visit (INDEPENDENT_AMBULATORY_CARE_PROVIDER_SITE_OTHER): Payer: Self-pay | Admitting: Nurse Practitioner

## 2024-04-13 ENCOUNTER — Encounter: Payer: Self-pay | Admitting: Nurse Practitioner

## 2024-04-13 VITALS — BP 124/82 | HR 79 | Ht 61.75 in | Wt 159.4 lb

## 2024-04-13 DIAGNOSIS — E785 Hyperlipidemia, unspecified: Secondary | ICD-10-CM

## 2024-04-13 DIAGNOSIS — Z124 Encounter for screening for malignant neoplasm of cervix: Secondary | ICD-10-CM | POA: Insufficient documentation

## 2024-04-13 DIAGNOSIS — Z1331 Encounter for screening for depression: Secondary | ICD-10-CM | POA: Diagnosis not present

## 2024-04-13 DIAGNOSIS — Z01419 Encounter for gynecological examination (general) (routine) without abnormal findings: Secondary | ICD-10-CM | POA: Diagnosis present

## 2024-04-13 DIAGNOSIS — Z780 Asymptomatic menopausal state: Secondary | ICD-10-CM | POA: Diagnosis not present

## 2024-04-13 DIAGNOSIS — Z833 Family history of diabetes mellitus: Secondary | ICD-10-CM

## 2024-04-13 NOTE — Progress Notes (Signed)
 Renee Abbott [birth year] [id number]   History:  63 y.o. G1P1001 presents for annual exam. No GYN complaints. Postmenopausal - no HRT. Normal pap history.   Gynecologic History Patient's last menstrual period was 08/30/2014 (exact date).   Contraception/Family planning: post menopausal status Sexually active: No  Health Maintenance Last Pap: 10/17/2019. Results were: Normal neg HPV Last mammogram: 01/11/2024. Results were: Normal Last colonoscopy: 2024. Results were: Benign polyp, 5-year recall Last Dexa: Not indicated     04/13/2024    3:09 PM  Depression screen PHQ 2/9  Decreased Interest 0  Down, Depressed, Hopeless 0  PHQ - 2 Score 0     Past medical history, past surgical history, family history and social history were all reviewed and documented in the EPIC chart. 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 Daughter in [place], married, 2 sons ages 61 and 1.   ROS:  A ROS was performed and pertinent positives and negatives are included.  Exam:  Vitals:   04/13/24 1509  BP: 124/82  Pulse: 79  SpO2: 99%  Weight: 159 lb 6.4 oz (72.3 kg)  Height: 5' 1.75" (1.568 m)   Body mass index is 29.39 kg/m.  General appearance:  Normal Thyroid :  Symmetrical, normal in size, without palpable masses or nodularity. Respiratory  Auscultation:  Clear without wheezing or rhonchi Cardiovascular  Auscultation:  Regular rate, without rubs, murmurs or gallops  Edema/varicosities:  Not grossly evident Abdominal  Soft,nontender, without masses, guarding or rebound.  Liver/spleen:  No organomegaly noted  Hernia:  None appreciated  Skin  Inspection:  Grossly normal Breasts: Examined lying and sitting.   Right: Without masses, retractions, nipple discharge or axillary adenopathy.   Left: Without masses, retractions, nipple discharge or axillary adenopathy. Pelvic: External genitalia:  no lesions              Urethra:  normal appearing urethra with no masses, tenderness or lesions               Bartholins and Skenes: normal                 Vagina: normal appearing vagina with normal color and discharge, no lesions              Cervix: no lesions Bimanual Exam:  Uterus:  no masses or tenderness              Adnexa: no mass, fullness, tenderness              Rectovaginal: Deferred              Anus:  normal, no lesions  Patient informed chaperone available to be present for breast and pelvic exam. Patient has requested no chaperone to be present. Patient has been advised what will be completed during breast and pelvic exam.   Assessment/Plan:  63 y.o. G1P1001 for annual exam.   Well female exam with routine gynecological exam - Plan: Cytology - PAP( [facility]), CBC with Differential/Platelet, Comprehensive metabolic panel with GFR. Education provided on SBEs, importance of preventative screenings, current guidelines, high calcium diet, regular exercise, and multivitamin daily.   Cervical cancer screening - Plan: Cytology - PAP( [place]). Normal pap history.   Postmenopausal - no HRT, no bleeding  Hyperlipidemia, unspecified hyperlipidemia type - Plan: Lipid panel  Family history of diabetes mellitus in father - Plan: Hemoglobin A1c  Screening for breast cancer - Normal mammogram history.  Continue annual screenings.  Normal breast exam today.  Screening for colon cancer -  2024 colonoscopy. Will repeat at 5-year interval per GI's recommendation.   Screening for osteoporosis - Average risk. Will plan DXA at age 8.   Return in about 1 year (around 04/13/2025) for Annual.    Andee Bamberger DNP, 3:24 PM 04/13/2024

## 2024-04-14 ENCOUNTER — Ambulatory Visit: Payer: Self-pay | Admitting: Nurse Practitioner

## 2024-04-14 LAB — CBC WITH DIFFERENTIAL/PLATELET
Absolute Lymphocytes: 2755 {cells}/uL (ref 850–3900)
Absolute Monocytes: 864 {cells}/uL (ref 200–950)
Basophils Absolute: 67 {cells}/uL (ref 0–200)
Basophils Relative: 0.7 %
Eosinophils Absolute: 96 {cells}/uL (ref 15–500)
Eosinophils Relative: 1 %
HCT: 42 % (ref 35.0–45.0)
Hemoglobin: 14.1 g/dL (ref 11.7–15.5)
MCH: 30.6 pg (ref 27.0–33.0)
MCHC: 33.6 g/dL (ref 32.0–36.0)
MCV: 91.1 fL (ref 80.0–100.0)
MPV: 10.4 fL (ref 7.5–12.5)
Monocytes Relative: 9 %
Neutro Abs: 5818 {cells}/uL (ref 1500–7800)
Neutrophils Relative %: 60.6 %
Platelets: 305 10*3/uL (ref 140–400)
RBC: 4.61 10*6/uL (ref 3.80–5.10)
RDW: 11.9 % (ref 11.0–15.0)
Total Lymphocyte: 28.7 %
WBC: 9.6 10*3/uL (ref 3.8–10.8)

## 2024-04-14 LAB — HEMOGLOBIN A1C
Hgb A1c MFr Bld: 5.5 % (ref <5.7)
Mean Plasma Glucose: 111 mg/dL
eAG (mmol/L): 6.2 mmol/L

## 2024-04-14 LAB — COMPREHENSIVE METABOLIC PANEL WITH GFR
AG Ratio: 1.7 (calc) (ref 1.0–2.5)
ALT: 14 U/L (ref 6–29)
AST: 18 U/L (ref 10–35)
Albumin: 4.7 g/dL (ref 3.6–5.1)
Alkaline phosphatase (APISO): 82 U/L (ref 37–153)
BUN: 13 mg/dL (ref 7–25)
CO2: 26 mmol/L (ref 20–32)
Calcium: 10.1 mg/dL (ref 8.6–10.4)
Chloride: 102 mmol/L (ref 98–110)
Creat: 1 mg/dL (ref 0.50–1.05)
Globulin: 2.7 g/dL (ref 1.9–3.7)
Glucose, Bld: 91 mg/dL (ref 65–99)
Potassium: 5.1 mmol/L (ref 3.5–5.3)
Sodium: 139 mmol/L (ref 135–146)
Total Bilirubin: 0.6 mg/dL (ref 0.2–1.2)
Total Protein: 7.4 g/dL (ref 6.1–8.1)
eGFR: 64 mL/min/{1.73_m2} (ref >=60)

## 2024-04-14 LAB — LIPID PANEL
Cholesterol: 250 mg/dL — ABNORMAL HIGH (ref <200)
HDL: 61 mg/dL (ref >=50)
LDL Cholesterol (Calc): 164 mg/dL — ABNORMAL HIGH
Non-HDL Cholesterol (Calc): 189 mg/dL — ABNORMAL HIGH (ref <130)
Total CHOL/HDL Ratio: 4.1 (calc) (ref <5.0)
Triglycerides: 123 mg/dL (ref <150)

## 2024-04-17 LAB — CYTOLOGY - PAP
Comment: NEGATIVE
Diagnosis: NEGATIVE
Diagnosis: REACTIVE
High risk HPV: NEGATIVE
